# Patient Record
Sex: Female | Born: 1980 | Race: White | Hispanic: No | Marital: Married | Smoking: Never smoker
Health system: Southern US, Community
[De-identification: ages and names within clinical notes are randomized; demographics above are authoritative.]

## PROBLEM LIST (undated history)

## (undated) DIAGNOSIS — E119 Type 2 diabetes mellitus without complications: Secondary | ICD-10-CM

## (undated) HISTORY — PX: NECK SURGERY: SHX720

---

## 2010-12-04 ENCOUNTER — Emergency Department: Payer: Self-pay | Admitting: Emergency Medicine

## 2015-02-02 ENCOUNTER — Emergency Department: Payer: Self-pay | Admitting: Emergency Medicine

## 2018-03-19 ENCOUNTER — Emergency Department: Payer: BC Managed Care – PPO

## 2018-03-19 ENCOUNTER — Encounter: Payer: Self-pay | Admitting: Internal Medicine

## 2018-03-19 ENCOUNTER — Inpatient Hospital Stay
Admission: EM | Admit: 2018-03-19 | Discharge: 2018-03-22 | DRG: 638 | Disposition: A | Discharge status: Home / Self Care | Payer: BC Managed Care – PPO | Attending: Internal Medicine | Admitting: Internal Medicine

## 2018-03-19 DIAGNOSIS — K3184 Gastroparesis: Secondary | ICD-10-CM | POA: Diagnosis present

## 2018-03-19 DIAGNOSIS — E1010 Type 1 diabetes mellitus with ketoacidosis without coma: Secondary | ICD-10-CM

## 2018-03-19 DIAGNOSIS — E876 Hypokalemia: Secondary | ICD-10-CM | POA: Diagnosis not present

## 2018-03-19 DIAGNOSIS — Z9641 Presence of insulin pump (external) (internal): Secondary | ICD-10-CM | POA: Diagnosis present

## 2018-03-19 DIAGNOSIS — E1043 Type 1 diabetes mellitus with diabetic autonomic (poly)neuropathy: Secondary | ICD-10-CM | POA: Diagnosis present

## 2018-03-19 DIAGNOSIS — D6959 Other secondary thrombocytopenia: Secondary | ICD-10-CM | POA: Diagnosis present

## 2018-03-19 DIAGNOSIS — R1011 Right upper quadrant pain: Secondary | ICD-10-CM | POA: Diagnosis not present

## 2018-03-19 DIAGNOSIS — A084 Viral intestinal infection, unspecified: Secondary | ICD-10-CM | POA: Diagnosis present

## 2018-03-19 DIAGNOSIS — E1110 Type 2 diabetes mellitus with ketoacidosis without coma: Secondary | ICD-10-CM | POA: Diagnosis present

## 2018-03-19 DIAGNOSIS — E871 Hypo-osmolality and hyponatremia: Secondary | ICD-10-CM | POA: Diagnosis present

## 2018-03-19 DIAGNOSIS — E875 Hyperkalemia: Secondary | ICD-10-CM | POA: Diagnosis present

## 2018-03-19 DIAGNOSIS — J189 Pneumonia, unspecified organism: Secondary | ICD-10-CM

## 2018-03-19 HISTORY — DX: Type 2 diabetes mellitus without complications: E11.9

## 2018-03-19 LAB — URINALYSIS, COMPLETE (UACMP) WITH MICROSCOPIC
BILIRUBIN URINE: NEGATIVE
Glucose, UA: >=500 mg/dL — AB
KETONES UR: 80 mg/dL — AB
LEUKOCYTES UA: NEGATIVE
Nitrite: NEGATIVE
PROTEIN: 100 mg/dL — AB
SPECIFIC GRAVITY, URINE: 1.022 (ref 1.005–1.030)
pH: 5 (ref 5.0–8.0)

## 2018-03-19 LAB — CBC WITH DIFFERENTIAL/PLATELET
BASOS ABS: 0 10*3/uL (ref 0–0.1)
BASOS PCT: 0 %
EOS ABS: 0 10*3/uL (ref 0–0.7)
Eosinophils Relative: 0 %
HCT: 55.6 % — ABNORMAL HIGH (ref 35.0–47.0)
HEMOGLOBIN: 18 g/dL — AB (ref 12.0–16.0)
Lymphocytes Relative: 6 %
Lymphs Abs: 1.4 10*3/uL (ref 1.0–3.6)
MCH: 28.8 pg (ref 26.0–34.0)
MCHC: 32.3 g/dL (ref 32.0–36.0)
MCV: 89 fL (ref 80.0–100.0)
MONOS PCT: 2 %
Monocytes Absolute: 0.5 10*3/uL (ref 0.2–0.9)
NEUTROS PCT: 92 %
Neutro Abs: 22.7 10*3/uL — ABNORMAL HIGH (ref 1.4–6.5)
Platelets: 546 10*3/uL — ABNORMAL HIGH (ref 150–440)
RBC: 6.25 MIL/uL — ABNORMAL HIGH (ref 3.80–5.20)
RDW: 14.8 % — ABNORMAL HIGH (ref 11.5–14.5)
WBC: 24.7 10*3/uL — ABNORMAL HIGH (ref 3.6–11.0)

## 2018-03-19 LAB — COMPREHENSIVE METABOLIC PANEL
ALBUMIN: 5.7 g/dL — AB (ref 3.5–5.0)
ALK PHOS: 138 U/L — AB (ref 38–126)
ALT: 68 U/L — ABNORMAL HIGH (ref 14–54)
AST: 109 U/L — ABNORMAL HIGH (ref 15–41)
BUN: 17 mg/dL (ref 6–20)
CALCIUM: 9.5 mg/dL (ref 8.9–10.3)
Chloride: 104 mmol/L (ref 101–111)
Creatinine, Ser: 0.91 mg/dL (ref 0.44–1.00)
GFR calc Af Amer: >60 mL/min (ref >60)
GFR calc non Af Amer: >60 mL/min (ref >60)
GLUCOSE: 440 mg/dL — AB (ref 65–99)
Potassium: 5.6 mmol/L — ABNORMAL HIGH (ref 3.5–5.1)
SODIUM: 135 mmol/L (ref 135–145)
Total Bilirubin: 2 mg/dL — ABNORMAL HIGH (ref 0.3–1.2)
Total Protein: 10.5 g/dL — ABNORMAL HIGH (ref 6.5–8.1)

## 2018-03-19 LAB — GLUCOSE, CAPILLARY
GLUCOSE-CAPILLARY: 203 mg/dL — AB (ref 65–99)
GLUCOSE-CAPILLARY: 210 mg/dL — AB (ref 65–99)
GLUCOSE-CAPILLARY: 218 mg/dL — AB (ref 65–99)
Glucose-Capillary: 304 mg/dL — ABNORMAL HIGH (ref 65–99)
Glucose-Capillary: 247 mg/dL — ABNORMAL HIGH (ref 65–99)
Glucose-Capillary: 221 mg/dL — ABNORMAL HIGH (ref 65–99)

## 2018-03-19 LAB — MRSA PCR SCREENING: MRSA by PCR: NEGATIVE

## 2018-03-19 MED ORDER — LIDOCAINE HCL (PF) 1 % IJ SOLN
INTRAMUSCULAR | Status: AC
  Filled 2018-03-19: qty 5

## 2018-03-19 MED ORDER — DEXTROSE-NACL 5-0.45 % IV SOLN
INTRAVENOUS | Status: DC
  Administered 2018-03-19 – 2018-03-20 (×3): via INTRAVENOUS

## 2018-03-19 MED ORDER — SODIUM CHLORIDE 0.9 % IV SOLN: Freq: Once | INTRAVENOUS | Status: DC

## 2018-03-19 MED ORDER — ONDANSETRON HCL 4 MG/2ML IJ SOLN
INTRAMUSCULAR | Status: AC
  Administered 2018-03-19: 15:00:00
  Filled 2018-03-19: qty 2

## 2018-03-19 MED ORDER — ENOXAPARIN SODIUM 40 MG/0.4ML ~~LOC~~ SOLN
40.0000 mg | SUBCUTANEOUS | Status: DC
  Administered 2018-03-20: 40 mg via SUBCUTANEOUS
  Filled 2018-03-19: qty 0.4

## 2018-03-19 MED ORDER — MORPHINE SULFATE (PF) 2 MG/ML IV SOLN
2.0000 mg | INTRAVENOUS | Status: DC | PRN
  Administered 2018-03-19 (×2): 2 mg via INTRAVENOUS
  Filled 2018-03-19: qty 1

## 2018-03-19 MED ORDER — SODIUM CHLORIDE 0.9 % IV SOLN
1.0000 g | Freq: Every day | INTRAVENOUS | Status: DC
  Administered 2018-03-19 – 2018-03-21 (×3): 1 g via INTRAVENOUS
  Filled 2018-03-19 (×4): qty 10

## 2018-03-19 MED ORDER — SODIUM CHLORIDE 0.9 % IV SOLN
INTRAVENOUS | Status: DC
  Administered 2018-03-19: 19:00:00 via INTRAVENOUS

## 2018-03-19 MED ORDER — INSULIN ASPART 100 UNIT/ML ~~LOC~~ SOLN
10.0000 [IU] | Freq: Once | SUBCUTANEOUS | Status: AC
  Administered 2018-03-19: 10 [IU] via INTRAVENOUS
  Filled 2018-03-19: qty 1

## 2018-03-19 MED ORDER — OXYCODONE-ACETAMINOPHEN 5-325 MG PO TABS: 1.0000 | ORAL_TABLET | Freq: Four times a day (QID) | ORAL | Status: DC | PRN

## 2018-03-19 MED ORDER — FENTANYL CITRATE (PF) 100 MCG/2ML IJ SOLN
50.0000 ug | Freq: Once | INTRAMUSCULAR | Status: AC
  Administered 2018-03-19: 50 ug via INTRAVENOUS

## 2018-03-19 MED ORDER — SODIUM CHLORIDE 0.9 % IV SOLN
INTRAVENOUS | Status: DC
  Administered 2018-03-19: 3.8 [IU]/h via INTRAVENOUS
  Filled 2018-03-19: qty 1

## 2018-03-19 MED ORDER — LIDOCAINE HCL (PF) 1 % IJ SOLN: 5.0000 mL | Freq: Once | INTRAMUSCULAR | Status: DC

## 2018-03-19 MED ORDER — ACETAMINOPHEN 325 MG PO TABS: 650.0000 mg | ORAL_TABLET | Freq: Four times a day (QID) | ORAL | Status: DC | PRN

## 2018-03-19 MED ORDER — BISACODYL 5 MG PO TBEC: 5.0000 mg | DELAYED_RELEASE_TABLET | Freq: Every day | ORAL | Status: DC | PRN

## 2018-03-19 MED ORDER — MORPHINE SULFATE (PF) 2 MG/ML IV SOLN
INTRAVENOUS | Status: AC
  Filled 2018-03-19: qty 1

## 2018-03-19 MED ORDER — SODIUM CHLORIDE 0.9 % IV BOLUS
1000.0000 mL | Freq: Once | INTRAVENOUS | Status: AC
  Administered 2018-03-19: 1000 mL via INTRAVENOUS

## 2018-03-19 MED ORDER — SODIUM CHLORIDE 0.9 % IV SOLN: INTRAVENOUS | Status: AC

## 2018-03-19 MED ORDER — SODIUM CHLORIDE 0.9 % IV SOLN
INTRAVENOUS | Status: DC
  Administered 2018-03-20: 7.2 [IU]/h via INTRAVENOUS
  Filled 2018-03-19 (×8): qty 1

## 2018-03-19 MED ORDER — ONDANSETRON HCL 4 MG/2ML IJ SOLN
4.0000 mg | Freq: Four times a day (QID) | INTRAMUSCULAR | Status: DC | PRN
  Administered 2018-03-19 – 2018-03-21 (×3): 4 mg via INTRAVENOUS
  Filled 2018-03-19 (×2): qty 2

## 2018-03-19 MED ORDER — ONDANSETRON HCL 4 MG/2ML IJ SOLN
INTRAMUSCULAR | Status: AC
  Filled 2018-03-19: qty 2

## 2018-03-19 MED ORDER — SENNOSIDES-DOCUSATE SODIUM 8.6-50 MG PO TABS: 1.0000 | ORAL_TABLET | Freq: Every evening | ORAL | Status: DC | PRN

## 2018-03-19 NOTE — Progress Notes (Signed)
Pharmacy Antibiotic Note  Dyke Maesshley Cope is a 37 y.o. female admitted on 03/19/2018 with UTI.  Pharmacy has been consulted for CTX dosing.  Plan: CTX 1 g iv daily.   Height: 5\' 2"  (157.5 cm) Weight: 160 lb (72.6 kg) IBW/kg (Calculated) : 50.1  Temp (24hrs), Avg:98.4 F (36.9 C), Min:97.5 F (36.4 C), Max:99.6 F (37.6 C)  Recent Labs  Lab 03/19/18 1451  WBC 24.7*  CREATININE 0.91    Estimated Creatinine Clearance: 79.7 mL/min (by C-G formula based on SCr of 0.91 mg/dL).    Allergies  Allergen Reactions  . Sulfa Antibiotics Swelling    Antimicrobials this admission: CTX 3/28  >>   Dose adjustments this admission:   Microbiology results: 3/28 UCx: sent  3/28 MRSA PCR: sent  Thank you for allowing pharmacy to be a part of this patient's care.  Valentina GuChristy, Brenn Deziel D 03/19/2018 7:34 PM

## 2018-03-19 NOTE — H&P (Addendum)
[facility] - [place] [facility]   PATIENT NAME: Lindsey Gardner    MR#:  [id number]  DATE OF BIRTH:  1981-03-22  DATE OF ADMISSION:  03/19/2018  PRIMARY CARE PHYSICIAN: No primary care provider on file.   REQUESTING/REFERRING PHYSICIAN: Dr. Roxan Hockeyobinson.  CHIEF COMPLAINT:   Chief Complaint  Patient presents with  . Abdominal Pain   Nausea vomiting and abdominal pain since yesterday. HISTORY OF PRESENT ILLNESS:  Lindsey Gardner  is a 37 y.o. female with a known history of type 1 diabetes and history of DKA twice.  The patient was sent to ED due to above chief complaints.  Her son has some abdominal pain, soft to be viral infection a few days ago.  She developed abdominal pain with nausea no vomiting since yesterday.  She also has fever and dysuria.  She is found DKA with not calculated anion gap and hyperglycemia.  Insulin drip is started.  PAST MEDICAL HISTORY:   Past Medical History:  Diagnosis Date  . DM (diabetes mellitus) (HCC)     PAST SURGICAL HISTORY:  No surgical history.  SOCIAL HISTORY:   Social History   Tobacco Use  . Smoking status: Never Smoker  Substance Use Topics  . Alcohol use: Not on file    FAMILY HISTORY:   Family History  Problem Relation Age of Onset  . Diabetes Mother   . Hypertension Mother     DRUG ALLERGIES:  Not on File  REVIEW OF SYSTEMS:   Review of Systems  Constitutional: Positive for chills, fever and malaise/fatigue.  HENT: Negative for sore throat.   Eyes: Negative for blurred vision and double vision.  Respiratory: Positive for shortness of breath. Negative for cough, hemoptysis, wheezing and stridor.   Cardiovascular: Negative for chest pain, palpitations, orthopnea and leg swelling.  Gastrointestinal: Positive for abdominal pain, nausea and vomiting. Negative for blood in stool, diarrhea and melena.  Genitourinary: Positive for dysuria and frequency. Negative for flank pain, hematuria and urgency.    Musculoskeletal: Negative for back pain and joint pain.  Neurological: Negative for dizziness, sensory change, focal weakness, seizures, loss of consciousness, weakness and headaches.  Endo/Heme/Allergies: Negative for polydipsia.  Psychiatric/Behavioral: Negative for depression. The patient is not nervous/anxious.     MEDICATIONS AT HOME:   Prior to Admission medications   Not on File      VITAL SIGNS:  Blood pressure (!) 144/98, temperature (!) 97.5 F (36.4 C), temperature source Oral, resp. rate 19, height 5\' 2"  (1.575 m), weight 160 lb (72.6 kg), SpO2 100 %.  PHYSICAL EXAMINATION:  Physical Exam  GENERAL:  37 y.o.-year-old patient lying in the bed with no acute distress.  EYES: Pupils equal, round, reactive to light and accommodation. No scleral icterus. Extraocular muscles intact.  HEENT: Head atraumatic, normocephalic. Oropharynx and nasopharynx clear.  NECK:  Supple, no jugular venous distention. No thyroid enlargement, no tenderness.  LUNGS: Normal breath sounds bilaterally, no wheezing, rales,rhonchi or crepitation. No use of accessory muscles of respiration.  CARDIOVASCULAR: S1, S2 normal. No murmurs, rubs, or gallops.  ABDOMEN: Soft, nontender, nondistended. Bowel sounds present. No organomegaly or mass.  EXTREMITIES: No pedal edema, cyanosis, or clubbing.  NEUROLOGIC: Cranial nerves II through XII are intact. Muscle strength 5/5 in all extremities. Sensation intact. Gait not checked.  PSYCHIATRIC: The patient is alert and oriented x 3.  SKIN: No obvious rash, lesion, or ulcer.   LABORATORY PANEL:   CBC Recent Labs  Lab 03/19/18 1451  WBC 24.7*  HGB 18.0*  HCT 55.6*  PLT 546*   ------------------------------------------------------------------------------------------------------------------  Chemistries  Recent Labs  Lab 03/19/18 1451  NA 135  K 5.6*  CL 104  CO2 <7*  GLUCOSE 440*  BUN 17  CREATININE 0.91  CALCIUM 9.5  AST 109*  ALT 68*   ALKPHOS 138*  BILITOT 2.0*   ------------------------------------------------------------------------------------------------------------------  Cardiac Enzymes No results for input(s): TROPONINI in the last 168 hours. ------------------------------------------------------------------------------------------------------------------  RADIOLOGY:  Dg Chest Portable 1 View  Result Date: 03/19/2018 CLINICAL DATA:  Nausea and vomiting EXAM: PORTABLE CHEST 1 VIEW COMPARISON:  None. FINDINGS: The heart size and mediastinal contours are within normal limits. Both lungs are clear. The visualized skeletal structures are unremarkable. IMPRESSION: No active disease. Electronically Signed   By: Jasmine Pang M.D.   On: 03/19/2018 16:07      IMPRESSION AND PLAN:   DKA with type 1 diabetes. The patient will be admitted to stepdown unit.  BMP every 4 hours. Start insulin drip, normal saline IV and DKA protocol. Zofran as needed.  Hyperkalemia.  Continue insulin drip in the follow-up BMP.  UTI with leukocytosis. Start Rocephin and follow-up CBC and urine culture.  Abdominal pain with elevated liver function test.  Follow-up abdominal ultrasound.  Tachycardia.  Telemetry monitor.  Thrombocytopenia, due to reaction.  Follow-up CBC.  Discussed with Dr. Gerlene Burdock, intensivist. All the records are reviewed and case discussed with ED provider. Management plans discussed with the patient, family and they are in agreement.  CODE STATUS: Full code  TOTAL CRITICAL TIME TAKING CARE OF THIS PATIENT: 55 minutes.    Shaune Pollack M.D on 03/19/2018 at 5:56 PM  Between 7am to 6pm - Pager - [phone]  After 6pm go to [email or web address] - Scientist, research (life sciences) [facility]  Office  [phone]  CC: Primary care physician; No primary care provider on file.   Note: This dictation was prepared with Dragon dictation along with smaller phrase technology. Any transcriptional errors  that result from this process are unin

## 2018-03-19 NOTE — ED Triage Notes (Signed)
Pt presents today via [facility] for vomiting. Pt has nothing Po and [facility] reports 355 BS DKA type 1.

## 2018-03-19 NOTE — ED Provider Notes (Signed)
[facility] Emergency Department Provider Note    First MD Initiated Contact with Patient 03/19/18 1429     (approximate)  I have reviewed the triage vital signs and the nursing notes.   HISTORY  Chief Complaint Abdominal Pain    HPI Lindsey Gardner is a 37 y.o. female to the ER with intractable nausea vomiting some epigastric discomfort and elevated blood glucose at home.  Patient is a type I diabetic on insulin pump at home but states that due to nausea vomiting she has not been able to control her blood sugars.  Has had some dysuria.  No measured fevers.  Does feel very thirsty and she feels short of breath.  States that she feels similar to previous episodes of DKA.  No past medical history on file. No family history on file.  There are no active problems to display for this patient.     Prior to Admission medications   Not on File    Allergies Patient has no allergy information on record.    Social History Social History   Tobacco Use  . Smoking status: Not on file  Substance Use Topics  . Alcohol use: Not on file  . Drug use: Not on file    Review of Systems Patient denies headaches, rhinorrhea, blurry vision, numbness, shortness of breath, chest pain, edema, cough, abdominal pain, nausea, vomiting, diarrhea, dysuria, fevers, rashes or hallucinations unless otherwise stated above in HPI. ____________________________________________   PHYSICAL EXAM:  VITAL SIGNS: Vitals:   03/19/18 1452  BP: (!) 144/98  Resp: 19  Temp: (!) 97.5 F (36.4 C)  SpO2: 100%    Constitutional: Alert and oriented. ill appearing  Eyes: Conjunctivae are normal.  Head: Atraumatic. Nose: No congestion/rhinnorhea. Mouth/Throat: Mucous membranes are moist.   Neck: No stridor. Painless ROM.  Cardiovascular: Normal rate, regular rhythm. Grossly normal heart sounds.  Good peripheral circulation. Respiratory: kussmaul respirations, no crackles or  rhonchi Gastrointestinal: Soft with mild epigastric ttp. No distention. No abdominal bruits. No CVA tenderness. Genitourinary: deferred Musculoskeletal: No lower extremity tenderness nor edema.  No joint effusions. Neurologic:  Normal speech and language. No gross focal neurologic deficits are appreciated. No facial droop Skin:  Skin is warm, dry and intact. No rash noted. Psychiatric: Mood and affect are normal. Speech and behavior are normal.  ____________________________________________   LABS (all labs ordered are listed, but only abnormal results are displayed)  Results for orders placed or performed during the hospital encounter of 03/19/18 (from the past 24 hour(s))  CBC with Differential/Platelet     Status: Abnormal   Collection Time: 03/19/18  2:51 PM  Result Value Ref Range   WBC 24.7 (H) 3.6 - 11.0 K/uL   RBC 6.25 (H) 3.80 - 5.20 MIL/uL   Hemoglobin 18.0 (H) 12.0 - 16.0 g/dL   HCT 16.155.6 (H) 09.635.0 - 04.547.0 %   MCV 89.0 80.0 - 100.0 fL   MCH 28.8 26.0 - 34.0 pg   MCHC 32.3 32.0 - 36.0 g/dL   RDW 40.914.8 (H) 81.111.5 - 91.414.5 %   Platelets 546 (H) 150 - 440 K/uL   Neutrophils Relative % 92 %   Neutro Abs 22.7 (H) 1.4 - 6.5 K/uL   Lymphocytes Relative 6 %   Lymphs Abs 1.4 1.0 - 3.6 K/uL   Monocytes Relative 2 %   Monocytes Absolute 0.5 0.2 - 0.9 K/uL   Eosinophils Relative 0 %   Eosinophils Absolute 0.0 0 - 0.7 K/uL   Basophils  Relative 0 %   Basophils Absolute 0.0 0 - 0.1 K/uL  Comprehensive metabolic panel     Status: Abnormal   Collection Time: 03/19/18  2:51 PM  Result Value Ref Range   Sodium 135 135 - 145 mmol/L   Potassium 5.6 (H) 3.5 - 5.1 mmol/L   Chloride 104 101 - 111 mmol/L   CO2 <7 (L) 22 - 32 mmol/L   Glucose, Bld 440 (H) 65 - 99 mg/dL   BUN 17 6 - 20 mg/dL   Creatinine, Ser 1.61 0.44 - 1.00 mg/dL   Calcium 9.5 8.9 - 09.6 mg/dL   Total Protein 04.5 (H) 6.5 - 8.1 g/dL   Albumin 5.7 (H) 3.5 - 5.0 g/dL   AST 409 (H) 15 - 41 U/L   ALT 68 (H) 14 - 54 U/L    Alkaline Phosphatase 138 (H) 38 - 126 U/L   Total Bilirubin 2.0 (H) 0.3 - 1.2 mg/dL   GFR calc non Af Amer >60 >60 mL/min   GFR calc Af Amer >60 >60 mL/min   Anion gap NOT CALCULATED 5 - 15  Urinalysis, Complete w Microscopic     Status: Abnormal   Collection Time: 03/19/18  2:51 PM  Result Value Ref Range   Color, Urine YELLOW (A) YELLOW   APPearance CLEAR (A) CLEAR   Specific Gravity, Urine 1.022 1.005 - 1.030   pH 5.0 5.0 - 8.0   Glucose, UA >=500 (A) NEGATIVE mg/dL   Hgb urine dipstick MODERATE (A) NEGATIVE   Bilirubin Urine NEGATIVE NEGATIVE   Ketones, ur 80 (A) NEGATIVE mg/dL   Protein, ur 811 (A) NEGATIVE mg/dL   Nitrite NEGATIVE NEGATIVE   Leukocytes, UA NEGATIVE NEGATIVE   RBC / HPF 0-5 0 - 5 RBC/hpf   WBC, UA 6-30 0 - 5 WBC/hpf   Bacteria, UA RARE (A) NONE SEEN   Squamous Epithelial / LPF 0-5 (A) NONE SEEN   Mucus PRESENT    ____________________________________________  EKG My review and personal interpretation at Time:   17:35 Indication: dka  Rate: 130  Rhythm: sinus Axis: normal Other: normal intervals, no stemi,  ____________________________________________  RADIOLOGY  I personally reviewed all radiographic images ordered to evaluate for the above acute complaints and reviewed radiology reports and findings.  These findings were personally discussed with the patient.  Please see medical record for radiology report.  ____________________________________________   PROCEDURES  Procedure(s) performed:  .Critical Care Performed by: Willy Eddy, MD Authorized by: Willy Eddy, MD   Critical care provider statement:    Critical care time (minutes):  40   Critical care time was exclusive of:  Separately billable procedures and treating other patients   Critical care was necessary to treat or prevent imminent or life-threatening deterioration of the following conditions:  Endocrine crisis   Critical care was time spent personally by me on the  following activities:  Development of treatment plan with patient or surrogate, discussions with consultants, evaluation of patient's response to treatment, examination of patient, obtaining history from patient or surrogate, ordering and performing treatments and interventions, ordering and review of laboratory studies, ordering and review of radiographic studies, pulse oximetry, re-evaluation of patient's condition and review of old charts      Critical Care performed: yes ____________________________________________   INITIAL IMPRESSION / ASSESSMENT AND PLAN / ED COURSE  Pertinent labs & imaging results that were available during my care of the patient were reviewed by me and considered in my medical decision making (see chart for  details).  DDX: dka, hhs, uti, enteritis, gastritis, pna  Kendle Turbin is a 37 y.o. who presents to the ED with symptoms as described above.  Patient with evidence of DKA.  Noted to be hyperkalemic with profound high anion gap metabolic acidosis.  Patient given large volume IV resuscitation and started on insulin drip.  Ultrasound will be ordered to further characterize any biliary pathology that could have set up her symptoms but clinically it seems more consistent with a gastroenteritis.  Does have mild elevated LFTs which could be secondary to profound dehydration.  Patient will require hospitalization for additional IV fluids and resuscitation.      As part of my medical decision making, I reviewed the following data within the electronic MEDICAL RECORD NUMBER Nursing notes reviewed and incorporated, Labs reviewed, notes from prior ED visits.   ____________________________________________   FINAL CLINICAL IMPRESSION(S) / ED DIAGNOSES  Final diagnoses:  RUQ abdominal pain  Diabetic ketoacidosis without coma associated with type 1 diabetes mellitus (HCC)      NEW MEDICATIONS STARTED DURING THIS VISIT:  New Prescriptions   No medications on file      Note:  This document was prepared using Dragon voice recognition software and may include unintentional dictation errors.    Willy Eddy, MD 03/19/18 1740

## 2018-03-20 DIAGNOSIS — E1010 Type 1 diabetes mellitus with ketoacidosis without coma: Principal | ICD-10-CM

## 2018-03-20 LAB — GLUCOSE, CAPILLARY
GLUCOSE-CAPILLARY: 171 mg/dL — AB (ref 65–99)
GLUCOSE-CAPILLARY: 147 mg/dL — AB (ref 65–99)
GLUCOSE-CAPILLARY: 211 mg/dL — AB (ref 65–99)
GLUCOSE-CAPILLARY: 156 mg/dL — AB (ref 65–99)
GLUCOSE-CAPILLARY: 158 mg/dL — AB (ref 65–99)
GLUCOSE-CAPILLARY: 190 mg/dL — AB (ref 65–99)
GLUCOSE-CAPILLARY: 212 mg/dL — AB (ref 65–99)
Glucose-Capillary: 115 mg/dL — ABNORMAL HIGH (ref 65–99)
Glucose-Capillary: 122 mg/dL — ABNORMAL HIGH (ref 65–99)
Glucose-Capillary: 124 mg/dL — ABNORMAL HIGH (ref 65–99)
Glucose-Capillary: 125 mg/dL — ABNORMAL HIGH (ref 65–99)
Glucose-Capillary: 125 mg/dL — ABNORMAL HIGH (ref 65–99)
Glucose-Capillary: 128 mg/dL — ABNORMAL HIGH (ref 65–99)
Glucose-Capillary: 131 mg/dL — ABNORMAL HIGH (ref 65–99)
Glucose-Capillary: 141 mg/dL — ABNORMAL HIGH (ref 65–99)
Glucose-Capillary: 151 mg/dL — ABNORMAL HIGH (ref 65–99)
Glucose-Capillary: 172 mg/dL — ABNORMAL HIGH (ref 65–99)
Glucose-Capillary: 180 mg/dL — ABNORMAL HIGH (ref 65–99)
Glucose-Capillary: 181 mg/dL — ABNORMAL HIGH (ref 65–99)
Glucose-Capillary: 205 mg/dL — ABNORMAL HIGH (ref 65–99)
Glucose-Capillary: 208 mg/dL — ABNORMAL HIGH (ref 65–99)

## 2018-03-20 LAB — BASIC METABOLIC PANEL
ANION GAP: 8 (ref 5–15)
ANION GAP: 11 (ref 5–15)
Anion gap: 11 (ref 5–15)
Anion gap: 11 (ref 5–15)
Anion gap: 10 (ref 5–15)
BUN: 11 mg/dL (ref 6–20)
BUN: 10 mg/dL (ref 6–20)
BUN: 10 mg/dL (ref 6–20)
BUN: 9 mg/dL (ref 6–20)
BUN: 7 mg/dL (ref 6–20)
CALCIUM: 8.1 mg/dL — AB (ref 8.9–10.3)
CALCIUM: 8.3 mg/dL — AB (ref 8.9–10.3)
CALCIUM: 8.4 mg/dL — AB (ref 8.9–10.3)
CHLORIDE: 116 mmol/L — AB (ref 101–111)
CO2: 8 mmol/L — ABNORMAL LOW (ref 22–32)
CO2: 11 mmol/L — ABNORMAL LOW (ref 22–32)
CO2: 12 mmol/L — ABNORMAL LOW (ref 22–32)
CO2: 9 mmol/L — ABNORMAL LOW (ref 22–32)
CO2: 13 mmol/L — AB (ref 22–32)
CREATININE: 0.42 mg/dL — AB (ref 0.44–1.00)
Calcium: 7.9 mg/dL — ABNORMAL LOW (ref 8.9–10.3)
Calcium: 8 mg/dL — ABNORMAL LOW (ref 8.9–10.3)
Chloride: 115 mmol/L — ABNORMAL HIGH (ref 101–111)
Chloride: 113 mmol/L — ABNORMAL HIGH (ref 101–111)
Chloride: 114 mmol/L — ABNORMAL HIGH (ref 101–111)
Chloride: 113 mmol/L — ABNORMAL HIGH (ref 101–111)
Creatinine, Ser: 0.59 mg/dL (ref 0.44–1.00)
Creatinine, Ser: 0.55 mg/dL (ref 0.44–1.00)
Creatinine, Ser: 0.6 mg/dL (ref 0.44–1.00)
Creatinine, Ser: 0.6 mg/dL (ref 0.44–1.00)
GFR calc Af Amer: >60 mL/min (ref >60)
GFR calc Af Amer: >60 mL/min (ref >60)
GLUCOSE: 189 mg/dL — AB (ref 65–99)
Glucose, Bld: 144 mg/dL — ABNORMAL HIGH (ref 65–99)
Glucose, Bld: 129 mg/dL — ABNORMAL HIGH (ref 65–99)
Glucose, Bld: 220 mg/dL — ABNORMAL HIGH (ref 65–99)
Glucose, Bld: 112 mg/dL — ABNORMAL HIGH (ref 65–99)
POTASSIUM: 4.4 mmol/L (ref 3.5–5.1)
POTASSIUM: 3.4 mmol/L — AB (ref 3.5–5.1)
POTASSIUM: 3.5 mmol/L (ref 3.5–5.1)
POTASSIUM: 3.9 mmol/L (ref 3.5–5.1)
POTASSIUM: 3.8 mmol/L (ref 3.5–5.1)
SODIUM: 134 mmol/L — AB (ref 135–145)
SODIUM: 135 mmol/L (ref 135–145)
SODIUM: 134 mmol/L — AB (ref 135–145)
SODIUM: 133 mmol/L — AB (ref 135–145)
SODIUM: 139 mmol/L (ref 135–145)

## 2018-03-20 LAB — BLOOD GAS, VENOUS
ACID-BASE DEFICIT: 10.9 mmol/L — AB (ref 0.0–2.0)
Acid-base deficit: 7.5 mmol/L — ABNORMAL HIGH (ref 0.0–2.0)
BICARBONATE: 14.6 mmol/L — AB (ref 20.0–28.0)
BICARBONATE: 17.4 mmol/L — AB (ref 20.0–28.0)
FIO2: 21
O2 SAT: 58.2 %
O2 SAT: 70.5 %
PCO2 VEN: 31 mmHg — AB (ref 44.0–60.0)
PCO2 VEN: 33 mmHg — AB (ref 44.0–60.0)
PO2 VEN: 35 mmHg (ref 32.0–45.0)
PO2 VEN: 40 mmHg (ref 32.0–45.0)
Patient temperature: 37
Patient temperature: 37
pH, Ven: 7.28 (ref 7.250–7.430)
pH, Ven: 7.33 (ref 7.250–7.430)

## 2018-03-20 MED ORDER — PROMETHAZINE HCL 25 MG/ML IJ SOLN
10.0000 mg | Freq: Once | INTRAMUSCULAR | Status: AC
  Administered 2018-03-20: 10 mg via INTRAVENOUS
  Filled 2018-03-20: qty 1

## 2018-03-20 MED ORDER — PROMETHAZINE HCL 25 MG/ML IJ SOLN: 10.0000 mg | Freq: Four times a day (QID) | INTRAMUSCULAR | Status: DC | PRN

## 2018-03-20 NOTE — Progress Notes (Signed)
MD- Once CO2 is closer to 20 and you deem patient safe to transition to SQ insulin, please consider either of the following:  1. Have patient resume her insulin pump with all new supplies (husband to bring supplies).   2. Once pump resumed, discontinue IV insulin drip 1 hour after pump resumed  3. Place Insulin Pump order set into [facility] if pt's husband cannot get fresh insulin pump supplies to hospital by early this afternoon and pt ready to transition to SQ insulin, recommend the following:   1. Start Lantus 45 units daily (80% total basal insulin pt gets on pump).  Make sure pt gets Lantus 1 hour prior to d/c of IV Insulin drip  2. Start Novolog Moderate Correction Scale/ SSI (0-20 units) TID AC + HS  3. Start Novolog Meal Coverage: Novolog 3 units TID with meals (hold if pt eats <50% of meal)  4. Would have pt resume insulin pump tomorrow (03/30) and then can d/c SQ insulin orders tomorrow as well.  Patient will need to suspend her basal rates until 24 hours after last dose Lantus given.   Met with patient around 12:30pm.  Patient A&O but still having nausea.  Insulin Pump off but at beside.  Explained to pt that if the MD allows pt to resume her insulin pump that she will need to use all new supplies (insertion site, tubing, reservoir, insulin, etc) per hospital policy regarding insulin pumps and DKA.  Pt stated she would ask her husband to bring the supplies to the hospital. Not sure what time husband can get here.  Explained to patient (also relayed the following info to Dr. Verdell Carmine and pt's RN Romie Minus) that once pt's CO2 is closer to 20 that we can have pt resume her insulin pump with all new supplies (with the MD's permission) and then d/c the IV insulin drip 1 hour after the pump has been restarted.  If MD wishes for pt to transition to SQ insulin before the pump supplies arrive, we could transition to Lantus and Novolog regimen and have pt resume her insulin pump  tomorrow.  Pt would need to suspend her basal rates on her pump until 24 hours after Lantus given if she resumes her pump tomorrow AM and Lantus is given this afternoon to make sure the basal rates on the pump and the Lantus don't overlap.  Pt understood and said she would be agreeable to either situation.  Currently trying to contact husband to get fresh pump supplies.  Reviewed Insulin pump settings with patient.  They are as follows:  --Insulin Pump Settings--  Basal Rates: 12am- 1.9 units/hr 6am- 2.5 units/hr 11am- 2.4 units/hr 5pm- 2.9 units/hr  Total Basal Insulin per 24 hours period= 58.6 units  Carbohydrate Ratio: 1 unit for every 15 Grams of Carbohydrates  Correction/Sensitivity Factor: 1 unit for every 20 mg/dl above Target CBG  Target CBG: 100-130 mg/dl     --Will follow patient during hospitalization--  Wyn Quaker RN, MSN, CDE Diabetes Coordinator Inpatient Glycemic Control Team Team Pager: [phone] (8a-5p)

## 2018-03-20 NOTE — Progress Notes (Signed)
Inpatient Diabetes Program Recommendations  AACE/ADA: New Consensus Statement on Inpatient Glycemic Control (2015)  Target Ranges:  Prepandial:   less than 140 mg/dL      Peak postprandial:   less than 180 mg/dL (1-2 hours)      Critically ill patients:  140 - 180 mg/dL   Results for Lindsey Gardner, Lindsey Gardner (MRN [id number]) as of 03/20/2018 07:32  Ref. Range 03/19/2018 14:51  Sodium Latest Ref Range: 135 - 145 mmol/L 135  Potassium Latest Ref Range: 3.5 - 5.1 mmol/L 5.6 (H)  Chloride Latest Ref Range: 101 - 111 mmol/L 104  CO2 Latest Ref Range: 22 - 32 mmol/L <7 (L)  Glucose Latest Ref Range: 65 - 99 mg/dL 782440 (H)  BUN Latest Ref Range: 6 - 20 mg/dL 17  Creatinine Latest Ref Range: 0.44 - 1.00 mg/dL 9.560.91  Calcium Latest Ref Range: 8.9 - 10.3 mg/dL 9.5  Anion gap Latest Ref Range: 5 - 15  NOT CALCULATED    Admit with: DKA  History: Type 1 DM  Home DM Meds: Insulin Pump with Humalog  Current Insulin Orders: IV Insulin Drip       MD- Note patient uses Insulin Pump at home.  If she has access to fresh pump supplies, we can transition her back to her insulin pump once her acidosis has cleared.  Note 5am BMET today shows Anion Gap improved to 11, however, CO2 still Low this AM at 11.  Do Not recommend transitioning off the IV Insulin drip until CO2 is closer to 20.  When patient is ready to transition off drip (AND if she has extra pump supplies), can transition pt back to her insulin pump.  Usual protocol has pt resume pump and then d/c IV insulin drip 1 hour after pump resumed.    --Will follow patient during hospitalization--  Ambrose FinlandJeannine Johnston Batu Cassin RN, MSN, CDE Diabetes Coordinator Inpatient Glycemic Control Team Team Pager: [phone] (8a-5p)

## 2018-03-20 NOTE — Progress Notes (Signed)
[facility] - [place] [facility]   PATIENT NAME: Lindsey Gardner    MR#:  [id number]  DATE OF BIRTH:  [birth year]  SUBJECTIVE:   Patient presented to the hospital due to abdominal pain/nausea vomiting and noted to be in acute diabetic ketoacidosis.  This is likely secondary to patient developing a viral gastroenteritis which she got from her child.  Patient remains on insulin drip bicarb is still low.  Patient says nausea and vomiting is better controlled with some Zofran.  REVIEW OF SYSTEMS:    Review of Systems  Constitutional: Negative for chills and fever.  HENT: Negative for congestion and tinnitus.   Eyes: Negative for blurred vision and double vision.  Respiratory: Negative for cough, shortness of breath and wheezing.   Cardiovascular: Negative for chest pain, orthopnea and PND.  Gastrointestinal: Positive for nausea. Negative for abdominal pain, diarrhea and vomiting.  Genitourinary: Negative for dysuria and hematuria.  Neurological: Negative for dizziness, sensory change and focal weakness.  All other systems reviewed and are negative.   Nutrition: NPO Tolerating Diet: No  Tolerating PT: Ambulatory   DRUG ALLERGIES:   Allergies  Allergen Reactions  . Sulfa Antibiotics Swelling    VITALS:  Blood pressure 109/68, pulse 80, temperature 98.5 F (36.9 C), temperature source Oral, resp. rate 17, height 5\' 2"  (1.575 m), weight 72.6 kg (160 lb), SpO2 100 %.  PHYSICAL EXAMINATION:   Physical Exam  GENERAL:  37 y.o.-year-old patient lying in bed in no acute distress.  EYES: Pupils equal, round, reactive to light and accommodation. No scleral icterus. Extraocular muscles intact.  HEENT: Head atraumatic, normocephalic. Oropharynx and nasopharynx clear. Dry Oral Mucosa.  NECK:  Supple, no jugular venous distention. No thyroid enlargement, no tenderness.  LUNGS: Normal breath sounds bilaterally, no wheezing, rales, rhonchi. No use of accessory muscles of  respiration.  CARDIOVASCULAR: S1, S2 normal. No murmurs, rubs, or gallops.  ABDOMEN: Soft, nontender, nondistended. Bowel sounds present. No organomegaly or mass.  EXTREMITIES: No cyanosis, clubbing or edema b/l.    NEUROLOGIC: Cranial nerves II through XII are intact. No focal Motor or sensory deficits b/l.   PSYCHIATRIC: The patient is alert and oriented x 3.  SKIN: No obvious rash, lesion, or ulcer.    LABORATORY PANEL:   CBC Recent Labs  Lab 03/19/18 1451  WBC 24.7*  HGB 18.0*  HCT 55.6*  PLT 546*   ------------------------------------------------------------------------------------------------------------------  Chemistries  Recent Labs  Lab 03/19/18 1451  03/20/18 1250  NA 135   < > 133*  K 5.6*   < > 3.9  CL 104   < > 113*  CO2 <7*   < > 9*  GLUCOSE 440*   < > 220*  BUN 17   < > 9  CREATININE 0.91   < > 0.60  CALCIUM 9.5   < > 8.3*  AST 109*  --   --   ALT 68*  --   --   ALKPHOS 138*  --   --   BILITOT 2.0*  --   --    < > = values in this interval not displayed.   ------------------------------------------------------------------------------------------------------------------  Cardiac Enzymes No results for input(s): TROPONINI in the last 168 hours. ------------------------------------------------------------------------------------------------------------------  RADIOLOGY:  Dg Chest Portable 1 View  Result Date: 03/19/2018 CLINICAL DATA:  Nausea and vomiting EXAM: PORTABLE CHEST 1 VIEW COMPARISON:  None. FINDINGS: The heart size and mediastinal contours are within normal limits. Both lungs are clear. The visualized skeletal structures are unremarkable.  IMPRESSION: No active disease. Electronically Signed   By: Jasmine Pang M.D.   On: 03/19/2018 16:07   US Abdomen Limited Ruq  Result Date: 03/19/2018 CLINICAL DATA:  Right upper quadrant pain EXAM: ULTRASOUND ABDOMEN LIMITED RIGHT UPPER QUADRANT COMPARISON:  None. FINDINGS: Gallbladder: No gallstones or  wall thickening visualized. No sonographic Murphy sign noted by sonographer. Common bile duct: Diameter: 3 mm Liver: Increased echogenicity. No focal hepatic abnormality. Portal vein is patent on color Doppler imaging with normal direction of blood flow towards the liver. IMPRESSION: 1. Negative for gallstones or biliary dilatation 2. Increased hepatic echogenicity suggesting fatty infiltration Electronically Signed   By: Jasmine Pang M.D.   On: 03/19/2018 18:23     ASSESSMENT AND PLAN:   37 year old female with past medical history of type 1 diabetes with history of DKA who presents to the hospital due to abdominal pain nausea vomiting and noted to be in acute diabetic ketoacidosis.  1.  Acute diabetic ketoacidosis- secondary to patient developing a viral gastroenteritis from her child. -Patient is normally on an insulin pump and was using it and her blood sugars have been stable until she developed the viral illness. - Continue IV fluids, insulin drip.  Patient's bicarb is still less than 10.  We will transition her off the insulin drip once her bicarbonate and acidosis has improved. -Follow serial electrolytes and replace accordingly.  Patient can be transitioned back to the insulin pump as per diabetes coordinator once weaned off the insulin drip.  2. Suspected UTI - will cont. Ceftriaxone empirically.  - follow cultures.   3.  Leukocytosis-secondary to DKA/suspected UTI.  Continue IV antibiotics treat the underlying DKA, follow white cell count.  4.  Hyponatremia-mild secondary to severe hyperglycemia, should correct as her sugars and DKA improves.   All the records are reviewed and case discussed with Care Management/Social Worker. Management plans discussed with the patient, family and they are in agreement.  CODE STATUS: Full code  DVT Prophylaxis: Lovenox  TOTAL TIME TAKING CARE OF THIS PATIENT: 30 minutes.   POSSIBLE D/C IN 1-2 DAYS, DEPENDING ON CLINICAL  CONDITION.   Houston Siren M.D on 03/20/2018 at 4:24 PM  Between 7am to 6pm - Pager - [phone]  After 6pm go to [email or web address] - Social research officer, government  [facility] [place] [facility]  Office  [phone]  CC: Primary care physician; System, Pcp Not In

## 2018-03-20 NOTE — Care Management (Signed)
RNCM spoke with patient and her husband regarding PCP establishment. Patient states that [facility] [phone] PCP is Dr. Rocky LinkSzabo. Follow up appointment made for 03/31/18 at [facility].

## 2018-03-20 NOTE — Progress Notes (Signed)
Pharmacy Electrolyte Monitoring Consult:  Pharmacy consulted to assist in monitoring and replacing electrolytes in this 37 y.o. female admitted on 03/19/2018 with Abdominal Pain   Labs:  Sodium (mmol/L)  Date Value  03/20/2018 133 (L)   Potassium (mmol/L)  Date Value  03/20/2018 3.9   Calcium (mg/dL)  Date Value  54/09/811903/29/2019 8.3 (L)   Albumin (g/dL)  Date Value  14/78/295603/28/2019 5.7 (H)    Plan: No replacement necessary at this time.  Will check BMP, Mg and Ph with AM labs.   Pharmacy to continue to monitor and replace electrolytes per protocol.   Cleopatra CedarStephanie Hezekiah Veltre, PharmD Pharmacy Resident  03/20/2018 4:16 PM

## 2018-03-20 NOTE — Progress Notes (Signed)
   Name: Lindsey Gardner MRN: [id number] DOB: 09-Jun-1981     CONSULTATION DATE: 03/19/2018  Subjective: Lethargic  Objective: Worsening acidosis.  HISTORY OF PRESENT ILLNESS:    PAST MEDICAL HISTORY :   has a past medical history of DM (diabetes mellitus) (HCC).  has no past surgical history on file. Prior to Admission medications   Medication Sig Start Date End Date Taking? Authorizing Provider  insulin lispro (HUMALOG) 100 UNIT/ML injection Inject into the skin continuous. Via insulin pump   Yes [provider]  ondansetron (ZOFRAN-ODT) 4 MG disintegrating tablet Take 4 mg by mouth every 8 (eight) hours as needed for nausea.   Yes [provider]   Allergies  Allergen Reactions  . Sulfa Antibiotics Swelling    FAMILY HISTORY:  family history includes Diabetes in her mother; Hypertension in her mother. SOCIAL HISTORY:  reports that she has never smoked. She does not have any smokeless tobacco history on file. She reports that she does not use drugs.  REVIEW OF SYSTEMS:   Unable to obtain due to critical illness   VITAL SIGNS: Temp:  [98.2 F (36.8 C)-99.6 F (37.6 C)] 98.5 F (36.9 C) (03/29 1200) Pulse Rate:  [79-131] 80 (03/29 1500) Resp:  [12-26] 17 (03/29 1500) BP: (108-159)/(54-91) 109/68 (03/29 1500) SpO2:  [97 %-100 %] 100 % (03/29 1500)  Physical Examination:  Lethargic, oriented and no focal neurological deficits On room air, no distress, able to look in full sentences, equal air entry and no adventitious sounds S1 and S2 are audible with no murmur Benign abdominal exam with fever presents No leg edema  ASSESSMENT / PLAN:  DKA with worsening mixed metabolic acidosis with non-anion gap component with hyper chloremia. -Optimize hydration with half-normal saline, monitor anion gap and venous pH -Glycemic control with insulin drip and consider resuming insulin pump after correction of metabolic derangement.  Reactive leukocytosis,  infective etiology is unlikely was clear chest x-ray and clean urine specimen. -DC Rocephin if continues not to have SIRS.  Pseudohyponatremia with hyperglycemia -Monitor renal panel  Diabetic gastroparesis -Phenergan as needed  Full code  Supportive care  Critical care time 35 minutes

## 2018-03-20 NOTE — Care Management (Signed)
Message left for patient to establish with PCP as one is not listed and outside resource given to [facility] if she chooses.   RNCM will follow.  Patient has health insurance just no PCP listed.

## 2018-03-21 LAB — GLUCOSE, CAPILLARY
GLUCOSE-CAPILLARY: 144 mg/dL — AB (ref 65–99)
GLUCOSE-CAPILLARY: 115 mg/dL — AB (ref 65–99)
GLUCOSE-CAPILLARY: 143 mg/dL — AB (ref 65–99)
GLUCOSE-CAPILLARY: 139 mg/dL — AB (ref 65–99)
GLUCOSE-CAPILLARY: 158 mg/dL — AB (ref 65–99)
GLUCOSE-CAPILLARY: 204 mg/dL — AB (ref 65–99)
GLUCOSE-CAPILLARY: 210 mg/dL — AB (ref 65–99)
GLUCOSE-CAPILLARY: 163 mg/dL — AB (ref 65–99)
GLUCOSE-CAPILLARY: 95 mg/dL (ref 65–99)
GLUCOSE-CAPILLARY: 123 mg/dL — AB (ref 65–99)
GLUCOSE-CAPILLARY: 132 mg/dL — AB (ref 65–99)
Glucose-Capillary: 137 mg/dL — ABNORMAL HIGH (ref 65–99)
Glucose-Capillary: 148 mg/dL — ABNORMAL HIGH (ref 65–99)
Glucose-Capillary: 166 mg/dL — ABNORMAL HIGH (ref 65–99)
Glucose-Capillary: 175 mg/dL — ABNORMAL HIGH (ref 65–99)
Glucose-Capillary: 182 mg/dL — ABNORMAL HIGH (ref 65–99)
Glucose-Capillary: 190 mg/dL — ABNORMAL HIGH (ref 65–99)
Glucose-Capillary: 197 mg/dL — ABNORMAL HIGH (ref 65–99)
Glucose-Capillary: 45 mg/dL — ABNORMAL LOW (ref 65–99)
Glucose-Capillary: 57 mg/dL — ABNORMAL LOW (ref 65–99)
Glucose-Capillary: 90 mg/dL (ref 65–99)

## 2018-03-21 LAB — BASIC METABOLIC PANEL
ANION GAP: 11 (ref 5–15)
ANION GAP: 9 (ref 5–15)
Anion gap: 8 (ref 5–15)
Anion gap: 10 (ref 5–15)
Anion gap: 10 (ref 5–15)
BUN: <5 mg/dL — ABNORMAL LOW (ref 6–20)
BUN: <5 mg/dL — ABNORMAL LOW (ref 6–20)
BUN: 7 mg/dL (ref 6–20)
BUN: <5 mg/dL — ABNORMAL LOW (ref 6–20)
CALCIUM: 7.9 mg/dL — AB (ref 8.9–10.3)
CALCIUM: 8.2 mg/dL — AB (ref 8.9–10.3)
CALCIUM: 8.4 mg/dL — AB (ref 8.9–10.3)
CHLORIDE: 109 mmol/L (ref 101–111)
CHLORIDE: 110 mmol/L (ref 101–111)
CHLORIDE: 110 mmol/L (ref 101–111)
CHLORIDE: 112 mmol/L — AB (ref 101–111)
CHLORIDE: 112 mmol/L — AB (ref 101–111)
CO2: 17 mmol/L — ABNORMAL LOW (ref 22–32)
CO2: 18 mmol/L — AB (ref 22–32)
CO2: 18 mmol/L — AB (ref 22–32)
CO2: 20 mmol/L — AB (ref 22–32)
CO2: 21 mmol/L — AB (ref 22–32)
CREATININE: 0.51 mg/dL (ref 0.44–1.00)
CREATININE: 0.51 mg/dL (ref 0.44–1.00)
CREATININE: 0.46 mg/dL (ref 0.44–1.00)
CREATININE: 0.4 mg/dL — AB (ref 0.44–1.00)
Calcium: 8.3 mg/dL — ABNORMAL LOW (ref 8.9–10.3)
Calcium: 8.8 mg/dL — ABNORMAL LOW (ref 8.9–10.3)
Creatinine, Ser: 0.52 mg/dL (ref 0.44–1.00)
GFR calc Af Amer: >60 mL/min (ref >60)
GFR calc non Af Amer: >60 mL/min (ref >60)
GFR calc non Af Amer: >60 mL/min (ref >60)
GFR calc non Af Amer: >60 mL/min (ref >60)
GFR calc non Af Amer: >60 mL/min (ref >60)
GFR calc non Af Amer: >60 mL/min (ref >60)
GLUCOSE: 210 mg/dL — AB (ref 65–99)
GLUCOSE: 210 mg/dL — AB (ref 65–99)
GLUCOSE: 58 mg/dL — AB (ref 65–99)
Glucose, Bld: 175 mg/dL — ABNORMAL HIGH (ref 65–99)
Glucose, Bld: 126 mg/dL — ABNORMAL HIGH (ref 65–99)
POTASSIUM: 2.6 mmol/L — AB (ref 3.5–5.1)
Potassium: 3.1 mmol/L — ABNORMAL LOW (ref 3.5–5.1)
Potassium: 3.4 mmol/L — ABNORMAL LOW (ref 3.5–5.1)
Potassium: 3.4 mmol/L — ABNORMAL LOW (ref 3.5–5.1)
Potassium: 3 mmol/L — ABNORMAL LOW (ref 3.5–5.1)
Sodium: 138 mmol/L (ref 135–145)
Sodium: 138 mmol/L (ref 135–145)
Sodium: 138 mmol/L (ref 135–145)
Sodium: 139 mmol/L (ref 135–145)
Sodium: 142 mmol/L (ref 135–145)

## 2018-03-21 LAB — CBC WITH DIFFERENTIAL/PLATELET
Basophils Absolute: 0.1 10*3/uL (ref 0–0.1)
Basophils Relative: 1 %
EOS ABS: 0.1 10*3/uL (ref 0–0.7)
Eosinophils Relative: 1 %
HEMATOCRIT: 43.3 % (ref 35.0–47.0)
HEMOGLOBIN: 14.5 g/dL (ref 12.0–16.0)
LYMPHS ABS: 4.7 10*3/uL — AB (ref 1.0–3.6)
Lymphocytes Relative: 43 %
MCH: 28 pg (ref 26.0–34.0)
MCHC: 33.5 g/dL (ref 32.0–36.0)
MCV: 83.4 fL (ref 80.0–100.0)
MONO ABS: 0.8 10*3/uL (ref 0.2–0.9)
MONOS PCT: 8 %
NEUTROS PCT: 49 %
Neutro Abs: 5.3 10*3/uL (ref 1.4–6.5)
Platelets: 300 10*3/uL (ref 150–440)
RBC: 5.19 MIL/uL (ref 3.80–5.20)
RDW: 13.7 % (ref 11.5–14.5)
WBC: 11 10*3/uL (ref 3.6–11.0)

## 2018-03-21 LAB — PHOSPHORUS
Phosphorus: <1 mg/dL — CL (ref 2.5–4.6)
Phosphorus: 3.6 mg/dL (ref 2.5–4.6)
Phosphorus: <1 mg/dL — CL (ref 2.5–4.6)

## 2018-03-21 LAB — URINE CULTURE: Culture: 20000 — AB

## 2018-03-21 LAB — HEMOGLOBIN A1C
HEMOGLOBIN A1C: 9.5 % — AB (ref 4.8–5.6)
MEAN PLASMA GLUCOSE: 225.95 mg/dL

## 2018-03-21 LAB — MAGNESIUM
MAGNESIUM: 1.8 mg/dL (ref 1.7–2.4)
Magnesium: 1.7 mg/dL (ref 1.7–2.4)

## 2018-03-21 MED ORDER — POTASSIUM PHOSPHATES 15 MMOLE/5ML IV SOLN
60.0000 mmol | Freq: Once | INTRAVENOUS | Status: AC
  Administered 2018-03-21: 60 mmol via INTRAVENOUS
  Filled 2018-03-21: qty 20

## 2018-03-21 MED ORDER — INSULIN PUMP
Freq: Three times a day (TID) | SUBCUTANEOUS | Status: DC
  Administered 2018-03-21: 3 via SUBCUTANEOUS
  Administered 2018-03-21 – 2018-03-22 (×2): via SUBCUTANEOUS
  Filled 2018-03-21: qty 1

## 2018-03-21 MED ORDER — POTASSIUM CHLORIDE 20 MEQ PO PACK
40.0000 meq | PACK | ORAL | Status: DC
Start: 2018-03-21 — End: 2018-03-22
  Filled 2018-03-21: qty 2

## 2018-03-21 MED ORDER — POTASSIUM CHLORIDE 10 MEQ/100ML IV SOLN
10.0000 meq | INTRAVENOUS | Status: AC
  Administered 2018-03-21 (×5): 10 meq via INTRAVENOUS
  Filled 2018-03-21 (×4): qty 100

## 2018-03-21 MED ORDER — POTASSIUM PHOSPHATES 15 MMOLE/5ML IV SOLN
45.0000 mmol | Freq: Once | INTRAVENOUS | Status: AC
  Administered 2018-03-21: 45 mmol via INTRAVENOUS
  Filled 2018-03-21: qty 15

## 2018-03-21 MED ORDER — POTASSIUM CHLORIDE CRYS ER 20 MEQ PO TBCR
40.0000 meq | EXTENDED_RELEASE_TABLET | ORAL | Status: DC
  Administered 2018-03-21: 40 meq via ORAL
  Filled 2018-03-21 (×2): qty 2

## 2018-03-21 MED ORDER — POTASSIUM CHLORIDE CRYS ER 20 MEQ PO TBCR: 80.0000 meq | EXTENDED_RELEASE_TABLET | Freq: Once | ORAL | Status: DC

## 2018-03-21 NOTE — Progress Notes (Signed)
eLink Physician-Brief Progress Note Patient Name: Lindsey Gardner DOB: 09/18/1981 MRN: [id number]   Date of Service  03/21/2018  HPI/Events of Note  hypokalemia  eICU Interventions  replace     Intervention Category Minor Interventions: Electrolytes abnormality - evaluation and management  Lindsey Gardner 03/21/2018, 6:01 AM

## 2018-03-21 NOTE — Progress Notes (Signed)
Patient seen and examined. Off insulin infusion and now on her home insulin pump. Eating well and tolerating fluids. Still gets tachycardic with exertion but reports a baseline HR of 90-110. Blood pressure is stable and BGTs<200. Last potassium was 3.4 and she received oral supplementation with 40mEQ.   On exam, patient is pleasant and in no distress; lungs with normal breath sounds, HR mildly tachycardic with  Normal S1/S2, normal bowel sounds and no edema.   Blood pressure 113/83, pulse (!) 108, temperature 98.1 F (36.7 C), temperature source Oral, resp. rate 19, height 5\' 2"  (1.575 m), weight 160 lb (72.6 kg), SpO2 97 %. Patient is stable for transfers to med-surg with telemetry    [facility] ANP-BC Pulmonary and Critical Care Medicine [facility] Pager [phone] or [phone]  NB: This document was prepared using Dragon voice recognition software and may include unintentional dictation errors.

## 2018-03-21 NOTE — Progress Notes (Signed)
   Name: Lindsey Gardner MRN: [id number] DOB: [birth year]     CONSULTATION DATE: 03/19/2018  Subjective: Feels better and tolerating oral intake better than yesterday  Objective: Remains on insulin drip, anion gap closed and improved oral intake  HISTORY OF PRESENT ILLNESS:    PAST MEDICAL HISTORY :   has a past medical history of DM (diabetes mellitus) (HCC).  has no past surgical history on file. Prior to Admission medications   Medication Sig Start Date End Date Taking? Authorizing Provider  insulin lispro (HUMALOG) 100 UNIT/ML injection Inject into the skin continuous. Via insulin pump   Yes [provider]  ondansetron (ZOFRAN-ODT) 4 MG disintegrating tablet Take 4 mg by mouth every 8 (eight) hours as needed for nausea.   Yes [provider]   Allergies  Allergen Reactions  . Sulfa Antibiotics Swelling    FAMILY HISTORY:  family history includes Diabetes in her mother; Hypertension in her mother. SOCIAL HISTORY:  reports that she has never smoked. She does not have any smokeless tobacco history on file. She reports that she does not use drugs.  REVIEW OF SYSTEMS:   Unable to obtain due to critical illness   VITAL SIGNS: Temp:  [98.2 F (36.8 C)-98.6 F (37 C)] 98.5 F (36.9 C) (03/30 1200) Pulse Rate:  [78-109] 99 (03/30 1409) Resp:  [14-28] 15 (03/30 1500) BP: (97-137)/(60-116) 137/116 (03/30 1500) SpO2:  [95 %-100 %] 96 % (03/30 1409)  Physical Examination:  Awake, oriented and no focal neurological deficits On room air, no distress, able to look in full sentences, equal air entry and no adventitious sounds S1 and S2 are audible with no murmur Benign abdominal exam with fever presents No leg edema    ASSESSMENT / PLAN:  DKA.  Improved -Optimize hydration with half-normal saline, monitor anion gap and venous pH -Glycemic control, insulin pump, taper off insulin drip continue to monitor blood sugar.  Reactive leukocytosis, infective  etiology is unlikely was clear chest x-ray and clean urine specimen. -DC Rocephin if continues not to have SIRS.  Pseudohyponatremia (resolved ) with hyperglycemia -Monitor renal panel  Diabetic gastroparesis -Phenergan as needed  Full code  Supportive care  Critical care time 35 minutes

## 2018-03-21 NOTE — Progress Notes (Signed)
Patient ID: Lindsey Gardner, female   DOB: May 11, 1981, 37 y.o.   MRN: [id number]  [facility] PROGRESS NOTE  Lindsey Gardner WUJ:[id number] DOB: 01-May-1981 DOA: 03/19/2018 PCP: System, Pcp Not In  HPI/Subjective: Patient having some nausea.  Family member with gastroenteritis.  Patient had nausea and vomiting for 2 days prior to coming in.  Was admitted with DKA.  Objective: Vitals:   03/21/18 1300 03/21/18 1400  BP:    Pulse:    Resp: (!) 21 14  Temp:    SpO2:      Filed Weights   03/19/18 1430  Weight: 72.6 kg (160 lb)    ROS: Review of Systems  Constitutional: Negative for chills and fever.  Eyes: Negative for blurred vision.  Respiratory: Negative for cough and shortness of breath.   Cardiovascular: Negative for chest pain.  Gastrointestinal: Positive for nausea. Negative for abdominal pain, constipation, diarrhea and vomiting.  Genitourinary: Negative for dysuria.  Musculoskeletal: Negative for joint pain.  Neurological: Negative for dizziness and headaches.   Exam: Physical Exam  Constitutional: She is oriented to person, place, and time.  HENT:  Nose: No mucosal edema.  Mouth/Throat: No oropharyngeal exudate or posterior oropharyngeal edema.  Eyes: Pupils are equal, round, and reactive to light. Conjunctivae, EOM and lids are normal.  Neck: No JVD present. Carotid bruit is not present. No edema present. No thyroid mass and no thyromegaly present.  Cardiovascular: S1 normal and S2 normal. Exam reveals no gallop.  No murmur heard. Pulses:      Dorsalis pedis pulses are 2+ on the right side, and 2+ on the left side.  Respiratory: No respiratory distress. She has no wheezes. She has no rhonchi. She has no rales.  GI: Soft. Bowel sounds are normal. There is no tenderness.  Musculoskeletal:       Left ankle: She exhibits no swelling.  Lymphadenopathy:    She has no cervical adenopathy.  Neurological: She is alert and oriented to person, place, and time. No  cranial nerve deficit.  Skin: Skin is warm. No rash noted. Nails show no clubbing.  Psychiatric: She has a normal mood and affect.      Data Reviewed: Basic Metabolic Panel: Recent Labs  Lab 03/20/18 1250 03/20/18 1843 03/20/18 2342 03/21/18 0720 03/21/18 1145  NA 133* 139 138 138 138  K 3.9 3.8 2.6* 3.1* 3.4*  CL 113* 116* 112* 112* 110  CO2 9* 13* 17* 18* 18*  GLUCOSE 220* 112* 210* 175* 210*  BUN 9 7 7  <5* <5*  CREATININE 0.60 0.42* 0.52 0.51 0.51  CALCIUM 8.3* 8.4* 8.3* 7.9* 8.2*  MG  --   --  1.8  --   --   PHOS  --   --  <1.0* 3.6  --    Liver Function Tests: Recent Labs  Lab 03/19/18 1451  AST 109*  ALT 68*  ALKPHOS 138*  BILITOT 2.0*  PROT 10.5*  ALBUMIN 5.7*   CBC: Recent Labs  Lab 03/19/18 1451  WBC 24.7*  NEUTROABS 22.7*  HGB 18.0*  HCT 55.6*  MCV 89.0  PLT 546*    CBG: Recent Labs  Lab 03/21/18 1025 03/21/18 1127 03/21/18 1232 03/21/18 1335 03/21/18 1440  GLUCAP 204* 210* 163* 95 123*    Recent Results (from the past 240 hour(s))  Urine Culture     Status: Abnormal   Collection Time: 03/19/18  2:51 PM  Result Value Ref Range Status   Specimen Description   Final    URINE,  RANDOM Performed at [facility], [address], [place], [place] [address]    Special Requests   Final    NONE Performed at [facility], [address] [place], [place] [address]    Culture (A)  Final    20,000 COLONIES/mL GROUP B STREP(S.AGALACTIAE)ISOLATED TESTING AGAINST S. AGALACTIAE NOT ROUTINELY PERFORMED DUE TO PREDICTABILITY OF AMP/PEN/VAN SUSCEPTIBILITY. Performed at [facility], [address] [place], [place] [address]    Report Status 03/21/2018 FINAL  Final  MRSA PCR Screening     Status: None   Collection Time: 03/19/18  7:14 PM  Result Value Ref Range Status   MRSA by PCR NEGATIVE NEGATIVE Final    Comment:        The GeneXpert MRSA Assay (FDA approved for NASAL specimens only), is one component of  a comprehensive MRSA colonization surveillance program. It is not intended to diagnose MRSA infection nor to guide or monitor treatment for MRSA infections. Performed at [facility], [address] [place], [place] [address]      Studies: Dg Chest Portable 1 View  Result Date: 03/19/2018 CLINICAL DATA:  Nausea and vomiting EXAM: PORTABLE CHEST 1 VIEW COMPARISON:  None. FINDINGS: The heart size and mediastinal contours are within normal limits. Both lungs are clear. The visualized skeletal structures are unremarkable. IMPRESSION: No active disease. Electronically Signed   By: Jasmine Pang M.D.   On: 03/19/2018 16:07   US Abdomen Limited Ruq  Result Date: 03/19/2018 CLINICAL DATA:  Right upper quadrant pain EXAM: ULTRASOUND ABDOMEN LIMITED RIGHT UPPER QUADRANT COMPARISON:  None. FINDINGS: Gallbladder: No gallstones or wall thickening visualized. No sonographic Murphy sign noted by sonographer. Common bile duct: Diameter: 3 mm Liver: Increased echogenicity. No focal hepatic abnormality. Portal vein is patent on color Doppler imaging with normal direction of blood flow towards the liver. IMPRESSION: 1. Negative for gallstones or biliary dilatation 2. Increased hepatic echogenicity suggesting fatty infiltration Electronically Signed   By: Jasmine Pang M.D.   On: 03/19/2018 18:23    Scheduled Meds: . enoxaparin (LOVENOX) injection  40 mg Subcutaneous Q24H  . lidocaine (PF)  5 mL Intradermal Once  . potassium chloride  40 mEq Oral Q4H   Continuous Infusions: . sodium chloride 125 mL/hr at 03/20/18 1930  . cefTRIAXone (ROCEPHIN)  IV 1 g (03/20/18 1737)  . dextrose 5 % and 0.45% NaCl 125 mL/hr at 03/21/18 0612  . insulin (NOVOLIN-R) infusion 1 Units/hr (03/21/18 1336)    Assessment/Plan:  1. Diabetic ketoacidosis.  Patient was still on insulin drip when I saw her.  Anion gap closed but the bicarb still little bit low.  Hopefully will be able to get over to her insulin pump  soon. 2. Hypokalemia secondary to insulin drip.  On potassium replacement. 3. Urine culture growing out less than 20,000 group B strep.  Can consider stopping antibiotics. 4. Leukocytosis secondary to DKA 5. Hyponatremia secondary to DKA  Code Status:     Code Status Orders  (From admission, onward)        Start     Ordered   03/19/18 1910  Full code  Continuous     03/19/18 1909    Code Status History    This patient has a current code status but no historical code status.     Family Communication: As per critical care specialist Disposition Plan: Once off insulin drip likely can go to the floor if sugars stable on the insulin pump.  Hopefully  tomorrow can discharge home.  Consultants:  Critical care specialist  Time spent: 28 minutes  Delani Kohli [employer]  [employer]

## 2018-03-21 NOTE — Progress Notes (Signed)
Pharmacy Electrolyte Monitoring Consult:  Pharmacy consulted to assist in monitoring and replacing electrolytes in this 37 y.o. female admitted on 03/19/2018 with Abdominal Pain Pt in DKA- on insulin drip  Labs:  Sodium (mmol/L)  Date Value  03/21/2018 138   Potassium (mmol/L)  Date Value  03/21/2018 3.1 (L)   Magnesium (mg/dL)  Date Value  16/10/960403/29/2019 1.8   Phosphorus (mg/dL)  Date Value  54/09/811903/30/2019 3.6   Calcium (mg/dL)  Date Value  14/78/295603/30/2019 7.9 (L)   Albumin (g/dL)  Date Value  21/30/865703/28/2019 5.7 (H)    Plan: K=3.1 phos=3.6  Elink provider ordered 4 runs of IV KCL 10 MEQ and 80 MEQ KCL po once. I will continue with these orders, but will change po dose to 2 doses of 40 MEQ to avoid upsetting pt GI tract.Follow up BMP q 4 hr  Pharmacy to continue to monitor and replace electrolytes per protocol.   Olene FlossMelissa D Donshay Lupinski, Pharm.D, BCPS Clinical Pharmacist  03/21/2018

## 2018-03-21 NOTE — Progress Notes (Signed)
Patient requested for CBG to be checked and it was 45. Food and drink given and patient's recheck was 90. Patient states she felt much better and can tell when her blood sugar gets "high or low". [facility]. Patient transferred at this time and Dawn and NP notified of CBG readings.

## 2018-03-21 NOTE — Progress Notes (Signed)
Pharmacy Electrolyte Monitoring Consult:  Pharmacy consulted to assist in monitoring and replacing electrolytes in this [age] y.o. female admitted on 03/19/2018 with Abdominal Pain   Labs:  Sodium (mmol/L)  Date Value  03/20/2018 138   Potassium (mmol/L)  Date Value  03/20/2018 2.6 (LL)   Magnesium (mg/dL)  Date Value  40/98/119103/29/2019 1.8   Phosphorus (mg/dL)  Date Value  47/82/956203/29/2019 <1.0 (LL)   Calcium (mg/dL)  Date Value  13/08/657803/29/2019 8.3 (L)   Albumin (g/dL)  Date Value  46/96/295203/28/2019 5.7 (H)    Plan: No replacement necessary at this time.  Will check BMP, Mg and Ph with AM labs.   03/29 @ 2300 K 2.6, Phos < 1.0. Will supplement w/ Kphos 45 mmol IV x 1 over 6 hours and will check electrolytes w/ am labs.  Pharmacy to continue to monitor and replace electrolytes per protocol.   Thomasene Rippleavid Beckham Buxbaum, PharmD, BCPS Clinical Pharmacist 03/21/2018

## 2018-03-22 ENCOUNTER — Inpatient Hospital Stay: Payer: BC Managed Care – PPO

## 2018-03-22 LAB — BASIC METABOLIC PANEL
Anion gap: 11 (ref 5–15)
BUN: 6 mg/dL (ref 6–20)
CHLORIDE: 110 mmol/L (ref 101–111)
CO2: 21 mmol/L — AB (ref 22–32)
CREATININE: 0.38 mg/dL — AB (ref 0.44–1.00)
Calcium: 8.7 mg/dL — ABNORMAL LOW (ref 8.9–10.3)
GFR calc Af Amer: >60 mL/min (ref >60)
GFR calc non Af Amer: >60 mL/min (ref >60)
Glucose, Bld: 155 mg/dL — ABNORMAL HIGH (ref 65–99)
Potassium: 4.3 mmol/L (ref 3.5–5.1)
Sodium: 142 mmol/L (ref 135–145)

## 2018-03-22 LAB — HIV ANTIBODY (ROUTINE TESTING W REFLEX): HIV Screen 4th Generation wRfx: NONREACTIVE

## 2018-03-22 LAB — PHOSPHORUS: Phosphorus: 4.7 mg/dL — ABNORMAL HIGH (ref 2.5–4.6)

## 2018-03-22 LAB — MAGNESIUM: Magnesium: 1.9 mg/dL (ref 1.7–2.4)

## 2018-03-22 LAB — CALCIUM, IONIZED: Calcium, Ionized, Serum: 5.1 mg/dL (ref 4.5–5.6)

## 2018-03-22 LAB — GLUCOSE, CAPILLARY: Glucose-Capillary: 190 mg/dL — ABNORMAL HIGH (ref 65–99)

## 2018-03-22 MED ORDER — MAGNESIUM SULFATE 2 GM/50ML IV SOLN
2.0000 g | Freq: Once | INTRAVENOUS | Status: DC
  Filled 2018-03-22: qty 50

## 2018-03-22 MED ORDER — MAGNESIUM OXIDE 400 (241.3 MG) MG PO TABS
400.0000 mg | ORAL_TABLET | Freq: Once | ORAL | Status: AC
  Administered 2018-03-22: 400 mg via ORAL
  Filled 2018-03-22: qty 1

## 2018-03-22 NOTE — Progress Notes (Signed)
Pharmacy Electrolyte Monitoring Consult:  Pharmacy consulted to assist in monitoring and replacing electrolytes in this 37 y.o. female admitted on 03/19/2018 with Abdominal Pain Pt in DKA- on insulin drip  Labs:  Sodium (mmol/L)  Date Value  03/22/2018 142   Potassium (mmol/L)  Date Value  03/22/2018 4.3   Magnesium (mg/dL)  Date Value  16/10/960403/31/2019 1.9   Phosphorus (mg/dL)  Date Value  54/09/811903/31/2019 4.7 (H)   Calcium (mg/dL)  Date Value  14/78/295603/31/2019 8.7 (L)   Albumin (g/dL)  Date Value  21/30/865703/28/2019 5.7 (H)    Plan: K=4.3 phos=4.7, Mg=1.9 No supplementation needed. Patient is now off insulin drip and on the floor, therefore I will sign off.   Olene FlossMelissa D Tiana Sivertson, Pharm.D, BCPS Clinical Pharmacist  03/22/2018

## 2018-03-22 NOTE — Progress Notes (Signed)
Patient ID: Lindsey Gardner [facility] [place] [facility]        Lindsey Gardner was admitted to the Hospital on 03/19/2018 and Discharged  03/22/2018 and should be excused from work/school   for 7  days starting 03/19/2018 , may return to work/school without any restrictions.  Alford Highlandichard Eustolia Drennen M.D on 03/22/2018,at 8:11 AM  [facility] [place] [facility]    Office  [phone]

## 2018-03-22 NOTE — Discharge Summary (Signed)
[employer] - [place] at [facility]   PATIENT NAME: Lindsey Gardner    MR#:  [id number]  DATE OF BIRTH:  [birth year]  DATE OF ADMISSION:  03/19/2018 ADMITTING PHYSICIAN: Shaune Pollack, MD  DATE OF DISCHARGE: 03/22/2018 12:15 PM  PRIMARY CARE PHYSICIAN: Dr Nance Pear   ADMISSION DIAGNOSIS:  RUQ abdominal pain [R10.11] Diabetic ketoacidosis without coma associated with type 1 diabetes mellitus (HCC) [E10.10]  DISCHARGE DIAGNOSIS:  Active Problems:   DKA (diabetic ketoacidoses) (HCC)   SECONDARY DIAGNOSIS:   Past Medical History:  Diagnosis Date  . DM (diabetes mellitus) (HCC)     HOSPITAL COURSE:   1.  Diabetic ketoacidosis.  the patient was admitted to the ICU for insulin drip.  The patient normally does her insulin pump.  She was vomiting for 2 days prior to her presentation.  Family member with a viral gastroenteritis.  Patient feeling better at this point and tolerating diet.  She was switched off the insulin drip to her insulin pump.  Follow-up with her endocrinologist as outpatient. 2.  Hypokalemia and  hypomagnesemia.  These electrolytes were replaced during the hospital course. 3.  Hyponatremia secondary to diabetic ketoacidosis. 4.  Leukocytosis secondary to DKA 5.  Urine culture growing out less than 20,000 group B streptococcus.  This is not a urinary tract infection.  Antibiotics stopped.  DISCHARGE CONDITIONS:   Satisfactory  CONSULTS OBTAINED:  Seen by critical care specialist while in the ICU  DRUG ALLERGIES:   Allergies  Allergen Reactions  . Sulfa Antibiotics Swelling    DISCHARGE MEDICATIONS:   Allergies as of 03/22/2018      Reactions   Sulfa Antibiotics Swelling      Medication List    TAKE these medications   HUMALOG 100 UNIT/ML injection Generic drug:  insulin lispro Inject into the skin continuous. Via insulin pump   ondansetron 4 MG disintegrating tablet Commonly known as:  ZOFRAN-ODT Take 4 mg by mouth every 8  (eight) hours as needed for nausea.        DISCHARGE INSTRUCTIONS:   Follow-up PMD 6 days Follow-up with your endocrinologist  If you experience worsening of your admission symptoms, develop shortness of breath, life threatening emergency, suicidal or homicidal thoughts you must seek medical attention immediately by calling 911 or calling your MD immediately  if symptoms less severe.  You Must read complete instructions/literature along with all the possible adverse reactions/side effects for all the Medicines you take and that have been prescribed to you. Take any new Medicines after you have completely understood and accept all the possible adverse reactions/side effects.   Please note  You were cared for by a hospitalist during your hospital stay. If you have any questions about your discharge medications or the care you received while you were in the hospital after you are discharged, you can call the unit and asked to speak with the hospitalist on call if the hospitalist that took care of you is not available. Once you are discharged, your primary care physician will handle any further medical issues. Please note that NO REFILLS for any discharge medications will be authorized once you are discharged, as it is imperative that you return to your primary care physician (or establish a relationship with a primary care physician if you do not have one) for your aftercare needs so that they can reassess your need for medications and monitor your lab values.    Today   CHIEF COMPLAINT:   Chief Complaint  Patient  presents with  . Abdominal Pain    HISTORY OF PRESENT ILLNESS:  Lindsey Gardner  is a 37 y.o. female presented with abdominal pain nausea vomiting    VITAL SIGNS:  Blood pressure 126/90, pulse 89, temperature 98 F (36.7 C), temperature source Oral, resp. rate 16, height 5\' 2"  (1.575 m), weight 69.9 kg (154 lb), SpO2 98 %.    PHYSICAL EXAMINATION:  GENERAL:  37  y.o.-year-old patient lying in the bed with no acute distress.  EYES: Pupils equal, round, reactive to light and accommodation. No scleral icterus. Extraocular muscles intact.  HEENT: Head atraumatic, normocephalic. Oropharynx and nasopharynx clear.  NECK:  Supple, no jugular venous distention. No thyroid enlargement, no tenderness.  LUNGS: Normal breath sounds bilaterally, no wheezing, rales,rhonchi or crepitation. No use of accessory muscles of respiration.  CARDIOVASCULAR: S1, S2 normal. No murmurs, rubs, or gallops.  ABDOMEN: Soft, non-tender, non-distended. Bowel sounds present. No organomegaly or mass.  EXTREMITIES: No pedal edema, cyanosis, or clubbing.  NEUROLOGIC: Cranial nerves II through XII are intact. Muscle strength 5/5 in all extremities. Sensation intact. Gait not checked.  PSYCHIATRIC: The patient is alert and oriented x 3.  SKIN: No obvious rash, lesion, or ulcer.   DATA REVIEW:   CBC Recent Labs  Lab 03/21/18 2105  WBC 11.0  HGB 14.5  HCT 43.3  PLT 300    Chemistries  Recent Labs  Lab 03/19/18 1451  03/22/18 0449  NA 135   < > 142  K 5.6*   < > 4.3  CL 104   < > 110  CO2 <7*   < > 21*  GLUCOSE 440*   < > 155*  BUN 17   < > 6  CREATININE 0.91   < > 0.38*  CALCIUM 9.5   < > 8.7*  MG  --    < > 1.9  AST 109*  --   --   ALT 68*  --   --   ALKPHOS 138*  --   --   BILITOT 2.0*  --   --    < > = values in this interval not displayed.    Microbiology Results  Results for orders placed or performed during the hospital encounter of 03/19/18  Urine Culture     Status: Abnormal   Collection Time: 03/19/18  2:51 PM  Result Value Ref Range Status   Specimen Description   Final    URINE, RANDOM Performed at [facility], [address] [place], [place] [address]    Special Requests   Final    NONE Performed at [facility], [address] [place], [place] [address]    Culture (A)  Final    20,000 COLONIES/mL GROUP B  STREP(S.AGALACTIAE)ISOLATED TESTING AGAINST S. AGALACTIAE NOT ROUTINELY PERFORMED DUE TO PREDICTABILITY OF AMP/PEN/VAN SUSCEPTIBILITY. Performed at [facility], [address] [place], [place] [address]    Report Status 03/21/2018 FINAL  Final  MRSA PCR Screening     Status: None   Collection Time: 03/19/18  7:14 PM  Result Value Ref Range Status   MRSA by PCR NEGATIVE NEGATIVE Final    Comment:        The GeneXpert MRSA Assay (FDA approved for NASAL specimens only), is one component of a comprehensive MRSA colonization surveillance program. It is not intended to diagnose MRSA infection nor to guide or monitor treatment for MRSA infections. Performed at [facility], [address] [place], [place] [address]  RADIOLOGY:  Dg Chest Port 1 View  Result Date: 03/22/2018 CLINICAL DATA:  Evaluate pneumonia. EXAM: PORTABLE CHEST 1 VIEW COMPARISON:  03/19/2018 FINDINGS: The heart size and mediastinal contours are within normal limits. Both lungs are clear. The visualized skeletal structures are unremarkable. IMPRESSION: No active disease. Electronically Signed   By: Signa Kell M.D.   On: 03/22/2018 07:42      Management plans discussed with the patient, and she is in agreement.  CODE STATUS:     Code Status Orders  (From admission, onward)        Start     Ordered   03/19/18 1910  Full code  Continuous     03/19/18 1909    Code Status History    This patient has a current code status but no historical code status.      TOTAL TIME TAKING CARE OF THIS PATIENT: 34 minutes.    Alford Highland M.D on 03/22/2018 at 3:06 PM  Between 7am to 6pm - Pager - [phone]  After 6pm go to [email or web address] - password [email or web address] [facility]  [employer] Office  [phone]  CC: Primary care physician; Dr Nance Pear

## 2018-03-22 NOTE — Progress Notes (Signed)
03/22/2018 11:00 AM  Dyke MaesAshley Penza to be D/C'd Home per MD order.  Discussed prescriptions and follow up appointments with the patient. Prescriptions given to patient, medication list explained in detail. Pt verbalized understanding.  Allergies as of 03/22/2018      Reactions   Sulfa Antibiotics Swelling      Medication List    TAKE these medications   HUMALOG 100 UNIT/ML injection Generic drug:  insulin lispro Inject into the skin continuous. Via insulin pump   ondansetron 4 MG disintegrating tablet Commonly known as:  ZOFRAN-ODT Take 4 mg by mouth every 8 (eight) hours as needed for nausea.       Vitals:   03/21/18 2246 03/22/18 0438  BP: (!) 131/91 126/90  Pulse: 98 89  Resp: 20 16  Temp: 98.2 F (36.8 C) 98 F (36.7 C)  SpO2: 99% 98%    Skin clean, dry and intact without evidence of skin break down, no evidence of skin tears noted. IV catheter discontinued intact. Site without signs and symptoms of complications. Dressing and pressure applied. Pt denies pain at this time. No complaints noted.  An After Visit Summary was printed and given to the patient. Patient escorted via WC, and D/C home via private auto.  Bradly Chrisougherty, Zoejane Gaulin E

## 2018-03-23 LAB — GLUCOSE, CAPILLARY: Glucose-Capillary: 72 mg/dL (ref 65–99)

## 2019-05-12 IMAGING — DX DG CHEST 1V PORT
1 series · 1 of 1 positions shown · non-contrast
Comparison: 03/19/2018

CLINICAL DATA: Evaluate pneumonia.

EXAM:
PORTABLE CHEST 1 VIEW

[chest ap]
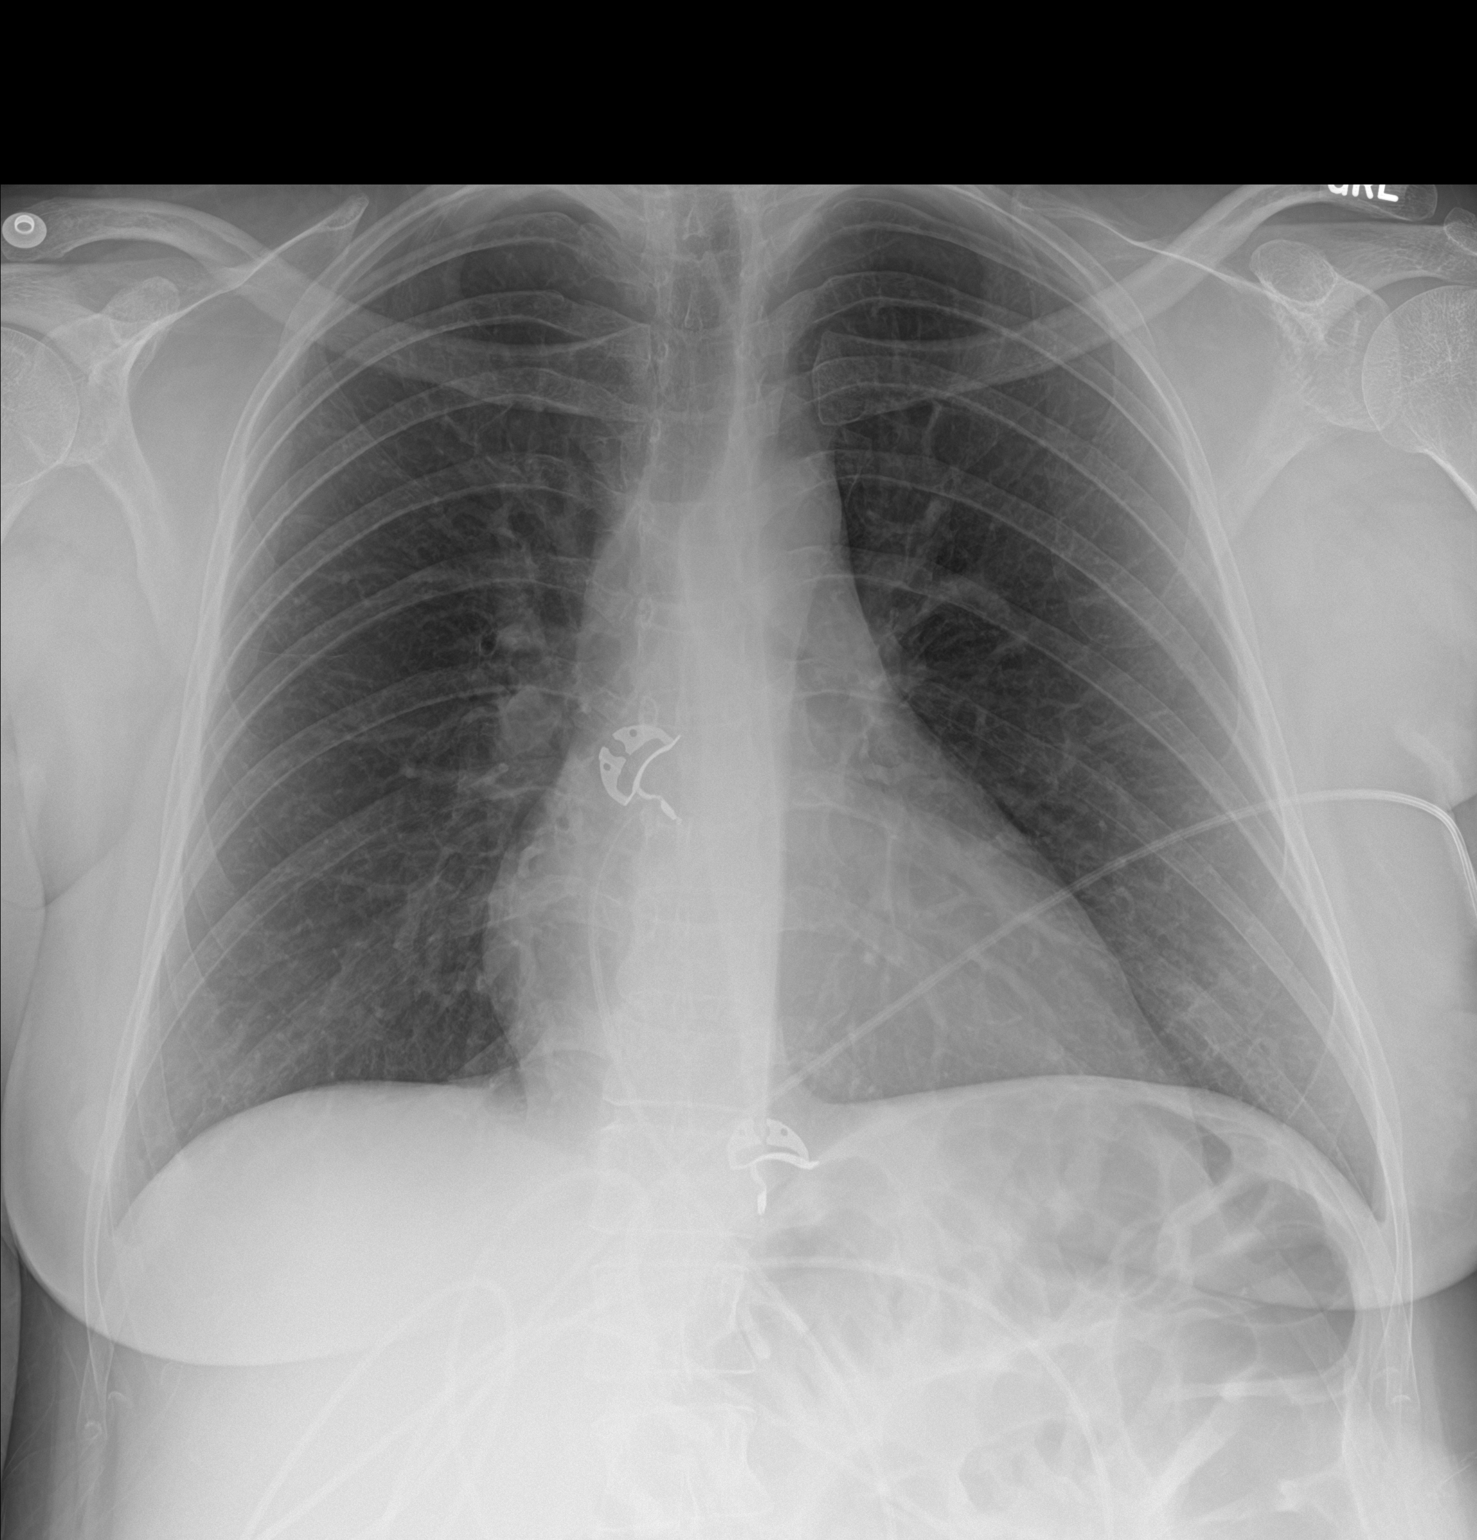

[1 of 1 positions shown; findings below may reference images not displayed]

FINDINGS: The heart size and mediastinal contours are within normal limits.
Both lungs are clear. The visualized skeletal structures are
unremarkable.
IMPRESSION: No active disease.

## 2019-10-11 IMAGING — US US ABDOMEN LIMITED
1 series · 14 of 25 positions shown · non-contrast
Comparison: None.

CLINICAL DATA: Right upper quadrant pain

EXAM:
ULTRASOUND ABDOMEN LIMITED RIGHT UPPER QUADRANT

[Series 1: us abdomen limited · 0.19mm/px · 14 of 26 slices shown]
[im 1/26]
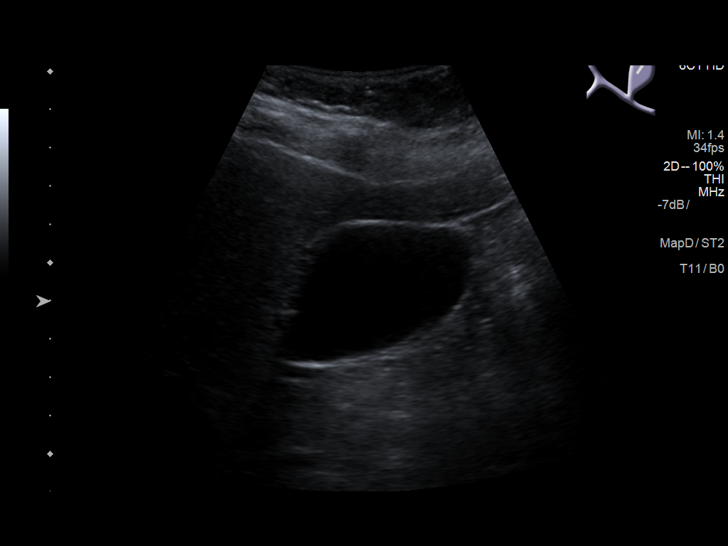
[im 3/26]
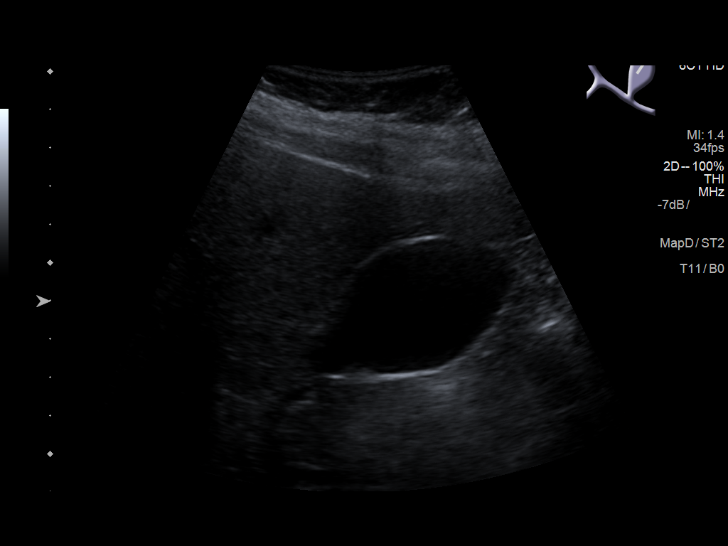
[im 5/26]
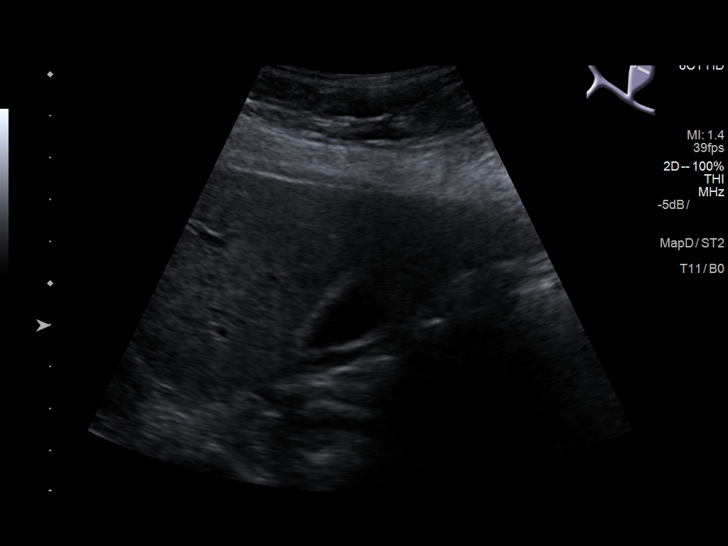
[im 7/26]
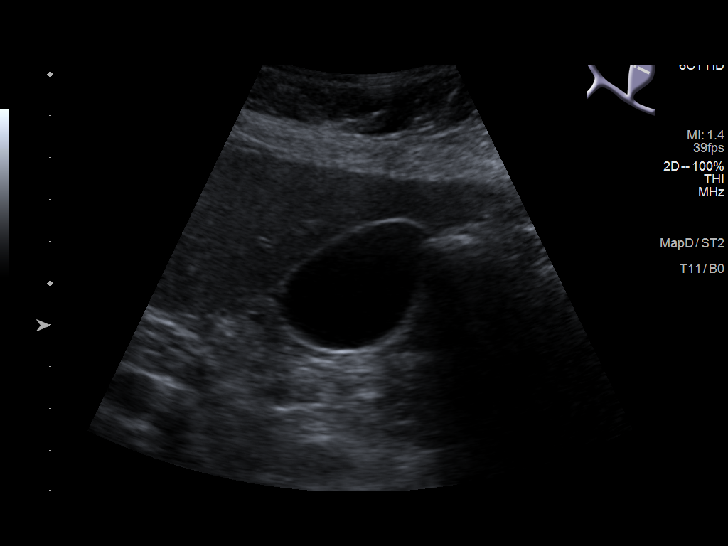
[im 9/26]
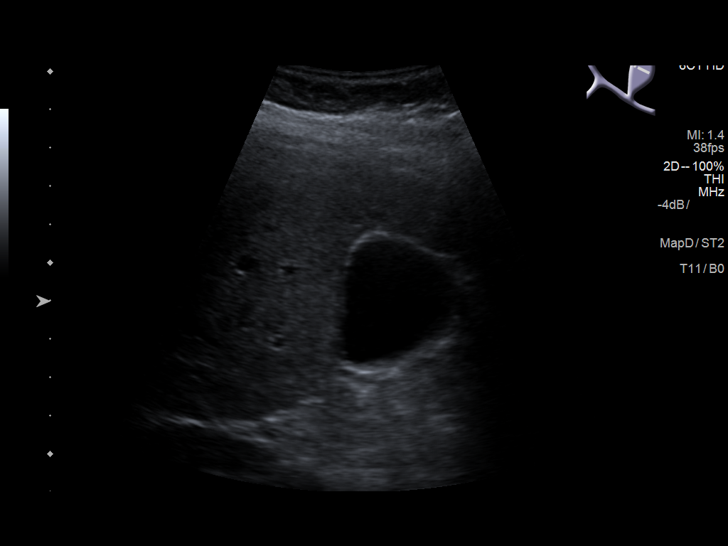
[im 10/26]
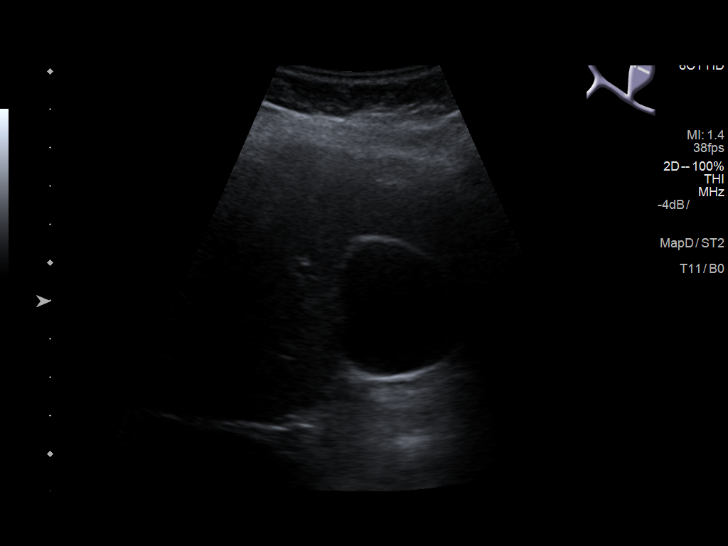
[im 12/26]
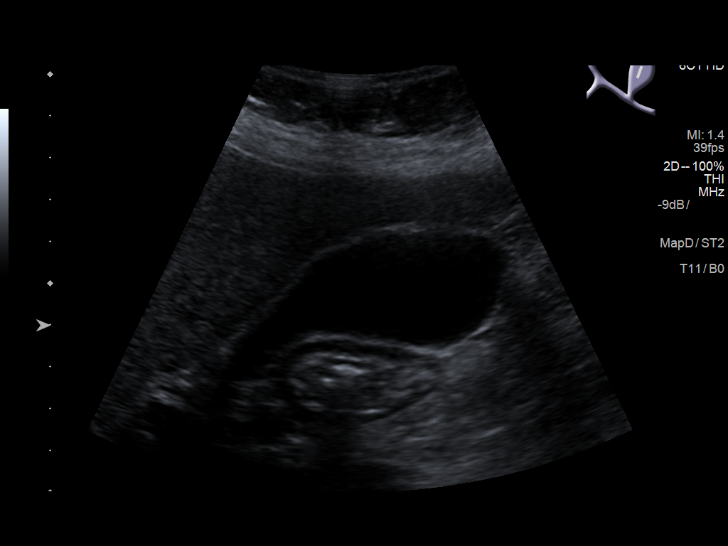
[im 14/26]
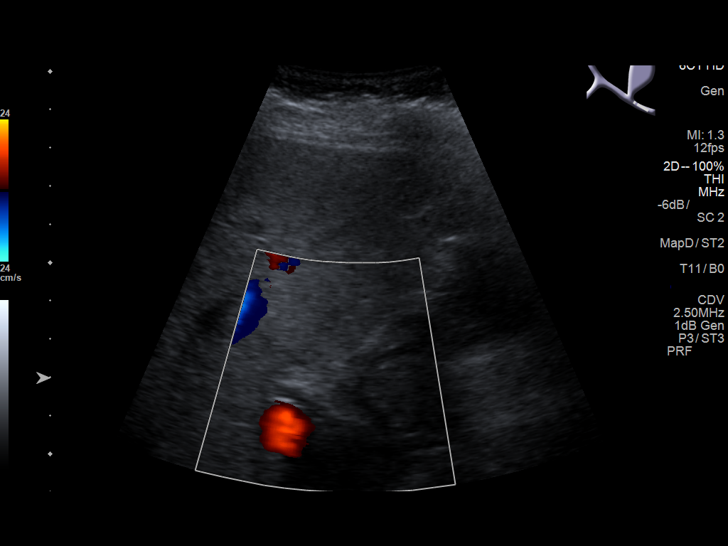
[im 16/26]
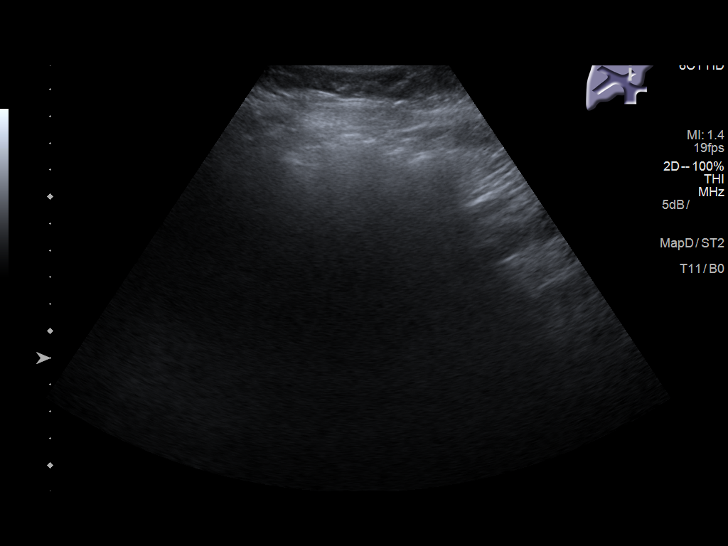
[im 17/26]
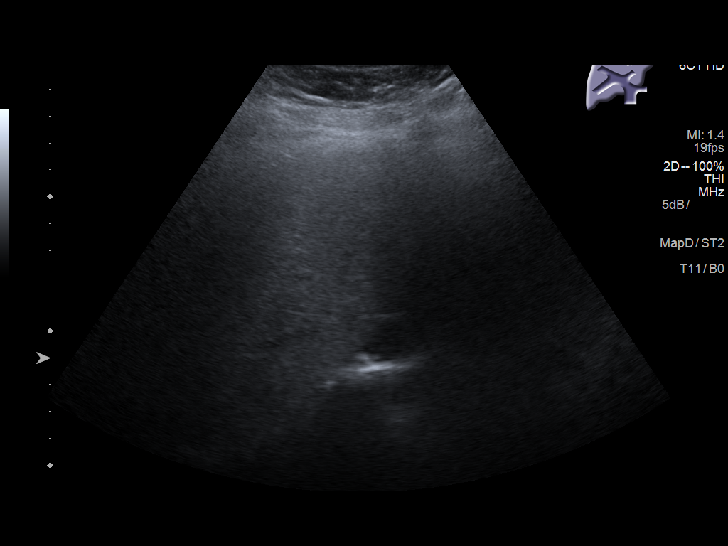
[im 19/26]
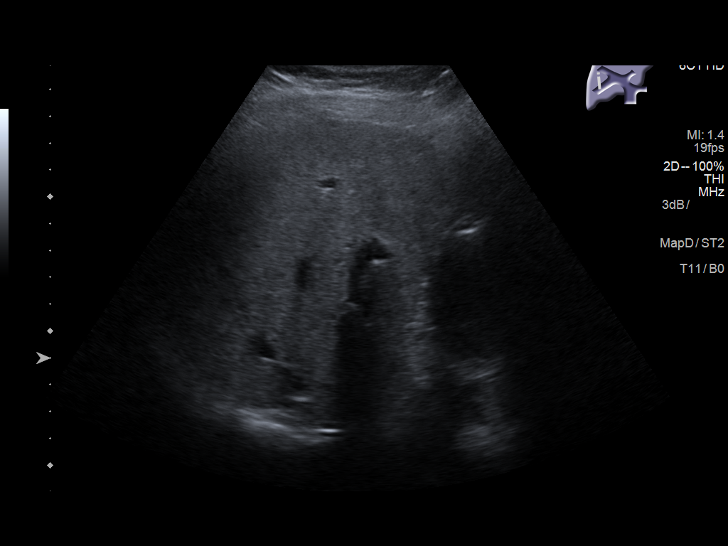
[im 21/26]
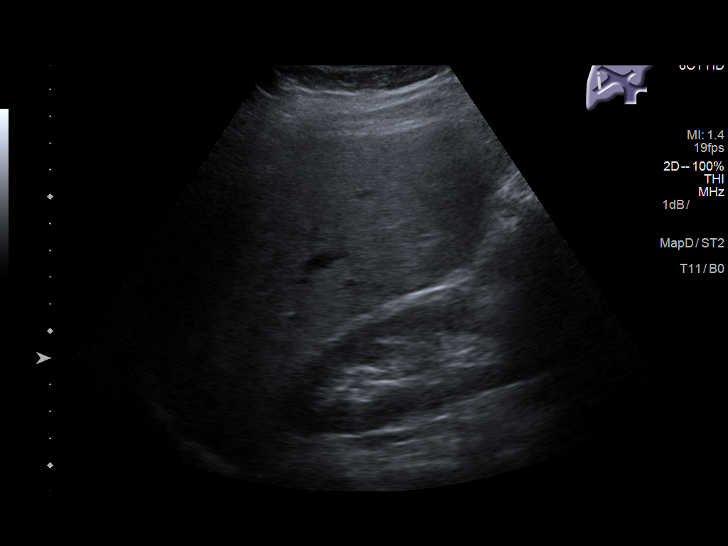
[im 23/26]
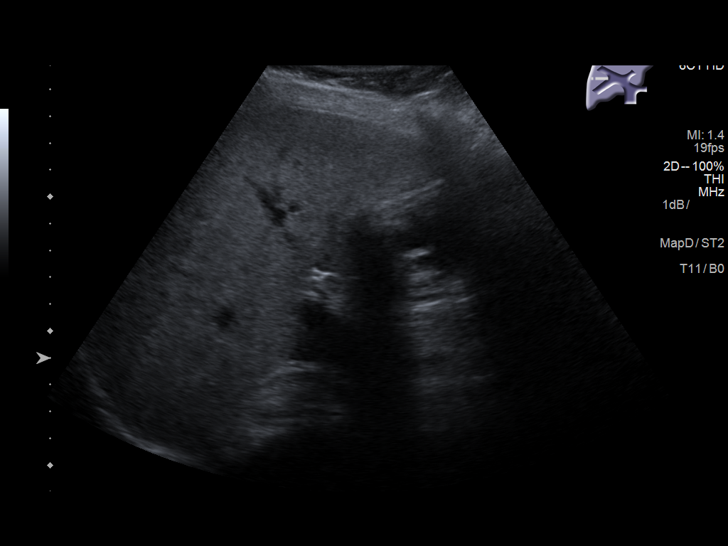
[im 26/26]
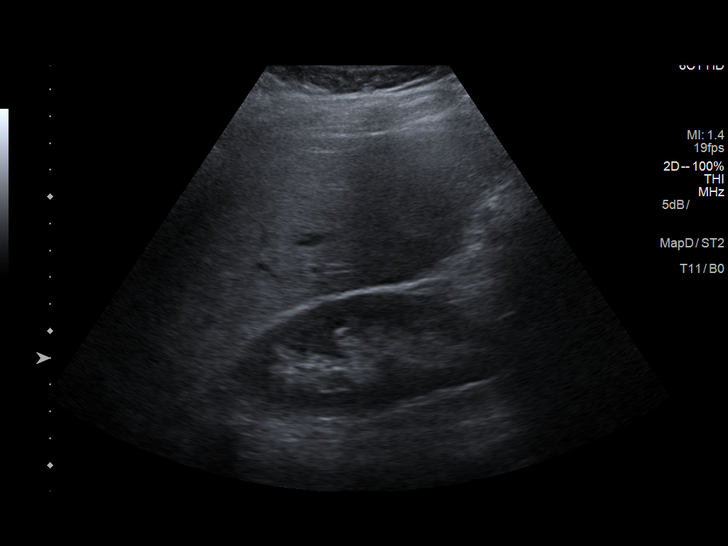

[14 of 25 positions shown; findings below may reference images not displayed]

FINDINGS: Gallbladder:

No gallstones or wall thickening visualized. No sonographic Murphy
sign noted by sonographer.

Common bile duct:

Diameter: 3 mm

Liver:

Increased echogenicity. No focal hepatic abnormality. Portal vein is
patent on color Doppler imaging with normal direction of blood flow
towards the liver.
IMPRESSION: 1. Negative for gallstones or biliary dilatation
2. Increased hepatic echogenicity suggesting fatty infiltration

## 2024-06-24 ENCOUNTER — Emergency Department
Admission: EM | Admit: 2024-06-24 | Discharge: 2024-06-24 | Disposition: A | Discharge status: Home / Self Care | Attending: Emergency Medicine | Admitting: Emergency Medicine

## 2024-06-24 DIAGNOSIS — E162 Hypoglycemia, unspecified: Secondary | ICD-10-CM

## 2024-06-24 DIAGNOSIS — R112 Nausea with vomiting, unspecified: Secondary | ICD-10-CM | POA: Diagnosis not present

## 2024-06-24 DIAGNOSIS — E11649 Type 2 diabetes mellitus with hypoglycemia without coma: Secondary | ICD-10-CM | POA: Insufficient documentation

## 2024-06-24 LAB — CBC WITH DIFFERENTIAL/PLATELET
Abs Immature Granulocytes: 0.04 10*3/uL (ref 0.00–0.07)
Basophils Absolute: 0 10*3/uL (ref 0.0–0.1)
Basophils Relative: 0 %
Eosinophils Absolute: 0 10*3/uL (ref 0.0–0.5)
Eosinophils Relative: 0 %
HCT: 38.3 % (ref 36.0–46.0)
Hemoglobin: 13.2 g/dL (ref 12.0–15.0)
Immature Granulocytes: 1 %
Lymphocytes Relative: 18 %
Lymphs Abs: 1.5 10*3/uL (ref 0.7–4.0)
MCH: 29.1 pg (ref 26.0–34.0)
MCHC: 34.5 g/dL (ref 30.0–36.0)
MCV: 84.4 fL (ref 80.0–100.0)
Monocytes Absolute: 0.5 10*3/uL (ref 0.1–1.0)
Monocytes Relative: 6 %
Neutro Abs: 6.1 10*3/uL (ref 1.7–7.7)
Neutrophils Relative %: 75 %
Platelets: 337 10*3/uL (ref 150–400)
RBC: 4.54 MIL/uL (ref 3.87–5.11)
RDW: 13.3 % (ref 11.5–15.5)
WBC: 8.1 10*3/uL (ref 4.0–10.5)
nRBC: 0 % (ref 0.0–0.2)

## 2024-06-24 LAB — CBG MONITORING, ED
Glucose-Capillary: 167 mg/dL — ABNORMAL HIGH (ref 70–99)
Glucose-Capillary: 196 mg/dL — ABNORMAL HIGH (ref 70–99)
Glucose-Capillary: 211 mg/dL — ABNORMAL HIGH (ref 70–99)

## 2024-06-24 LAB — COMPREHENSIVE METABOLIC PANEL WITH GFR
ALT: 11 U/L (ref 0–44)
AST: 24 U/L (ref 15–41)
Albumin: 4 g/dL (ref 3.5–5.0)
Alkaline Phosphatase: 68 U/L (ref 38–126)
Anion gap: 11 (ref 5–15)
BUN: 10 mg/dL (ref 6–20)
CO2: 20 mmol/L — ABNORMAL LOW (ref 22–32)
Calcium: 8.8 mg/dL — ABNORMAL LOW (ref 8.9–10.3)
Chloride: 105 mmol/L (ref 98–111)
Creatinine, Ser: 0.5 mg/dL (ref 0.44–1.00)
GFR, Estimated: >60 mL/min (ref >60)
Glucose, Bld: 192 mg/dL — ABNORMAL HIGH (ref 70–99)
Potassium: 3.8 mmol/L (ref 3.5–5.1)
Sodium: 136 mmol/L (ref 135–145)
Total Bilirubin: 0.6 mg/dL (ref 0.0–1.2)
Total Protein: 7.6 g/dL (ref 6.5–8.1)

## 2024-06-24 LAB — PREGNANCY, URINE: Preg Test, Ur: NEGATIVE

## 2024-06-24 MED ORDER — METOCLOPRAMIDE HCL 5 MG/ML IJ SOLN
10.0000 mg | Freq: Once | INTRAMUSCULAR | Status: AC
  Administered 2024-06-24: 10 mg via INTRAVENOUS
  Filled 2024-06-24: qty 2

## 2024-06-24 MED ORDER — SODIUM CHLORIDE 0.9 % IV BOLUS
500.0000 mL | Freq: Once | INTRAVENOUS | Status: AC
  Administered 2024-06-24: 500 mL via INTRAVENOUS

## 2024-06-24 NOTE — ED Provider Notes (Signed)
 [facility] Provider Note    Event Date/Time   First MD Initiated Contact with Patient 06/24/24 0207     (approximate)   History   Hypoglycemia   HPI  Lindsey Gardner is a 43 y.o. female with history of diabetes presenting with hypoglycemia.  Patient states that she has an insulin  pump that gives her a basal dose of lispro, will give herself a bolus at mealtimes.  Did give herself a bolus at dinnertime.  Did have an episode of nausea vomiting earlier but she denies any diarrhea, urinary symptoms, cough, chest pain, shortness of breath, fever or other infectious symptoms.  No sick contacts.  Per independent history from EMS, her glucose was around 40 during the night, was fluctuating between 40-80.  Patient states that she does not take any long-acting insulin  or other diabetic medications.  Independent history obtained from EMS as above.  On independent review, she is seen by endocrine at [facility], this was in May 2025, uses an OmniPod, had struggled to stay in range previously, has not been given correctional doses or increase her mealtime boluses resulting low sugars after that.  After that visit, they had discussed the Dexcom at, continued her on Ozempic, also turned off reverse correction.     Physical Exam   Triage Vital Signs: ED Triage Vitals  Encounter Vitals Group     BP      Girls Systolic BP Percentile      Girls Diastolic BP Percentile      Boys Systolic BP Percentile      Boys Diastolic BP Percentile      Pulse      Resp      Temp      Temp src      SpO2      Weight      Height      Head Circumference      Peak Flow      Pain Score      Pain Loc      Pain Education      Exclude from Growth Chart     Most recent vital signs: Vitals:   06/24/24 0330 06/24/24 0400  BP: 117/72 117/71  Pulse: 87 84  Resp: (!) 21 20  Temp:    SpO2: 98% 97%     General: Awake, no distress.  CV:  Good peripheral perfusion.  Resp:  Normal effort.   Clear Abd:  No distention.  Soft nontender Other:  Well-appearing, moving all 4 extremities   ED Results / Procedures / Treatments   Labs (all labs ordered are listed, but only abnormal results are displayed) Labs Reviewed  COMPREHENSIVE METABOLIC PANEL WITH GFR - Abnormal; Notable for the following components:      Result Value   CO2 20 (*)    Glucose, Bld 192 (*)    Calcium 8.8 (*)    All other components within normal limits  CBG MONITORING, ED - Abnormal; Notable for the following components:   Glucose-Capillary 167 (*)    All other components within normal limits  CBG MONITORING, ED - Abnormal; Notable for the following components:   Glucose-Capillary 196 (*)    All other components within normal limits  CBG MONITORING, ED - Abnormal; Notable for the following components:   Glucose-Capillary 211 (*)    All other components within normal limits  CBC WITH DIFFERENTIAL/PLATELET  PREGNANCY, URINE  CBG MONITORING, ED  CBG MONITORING, ED  CBG MONITORING, ED  CBG MONITORING, ED    PROCEDURES:  Critical Care performed: No  Procedures   MEDICATIONS ORDERED IN ED: Medications  metoCLOPramide (REGLAN) injection 10 mg (10 mg Intravenous Given 06/24/24 0303)  sodium chloride  0.9 % bolus 500 mL (500 mLs Intravenous New Bag/Given 06/24/24 0303)     IMPRESSION / MDM / ASSESSMENT AND PLAN / ED COURSE  I reviewed the triage vital signs and the nursing notes.                              Differential diagnosis includes, but is not limited to, overcorrection of insulin , early gastroenteritis, viral illness, medication side effect.  No abdominal pain on exam to warrant imaging at this time, no other infectious symptoms.  Will get labs, will have her monitor her glucose here.  If stable, she can be discharged with outpatient endocrine follow-up.  Shared decision making done with patient and she is agreeable with this plan.  Patient's presentation is most consistent with acute  presentation with potential threat to life or bodily function.  Independent interpretation of labs and imaging below.  Clinical course as below, reassessment patient is tolerating p.o.  Considered but no indication for inpatient admission at this time, she is safe for outpatient management.  Will discharge with strict precautions.  Instructed her to follow-up with endocrinologist in the morning to see if she needs adjustments to her insulin .  Shared decision making done with patient and she is agreeable with this plan.    Clinical Course as of 06/24/24 0438  Thu Jun 24, 2024  0255 Independent review of labs, pregnancy test is negative, no leukocytosis, electrolytes not severely deranged, she is not been hypoglycemic here, LFTs are normal. [TT]  0420 Glucose-Capillary(!): 211 Repeat blood glucose have not revealed any hypoglycemia, on the app, she has had no hypoglycemic episodes since being in the emergency department.  Nausea has improved, will p.o. challenge and plan to discharge.  Patient states that she is followed closely by endocrine, will be able to contact them tomorrow morning to discuss if she needs any adjustments to her insulin . [TT]    Clinical Course User Index [TT] Waymond, Lorelle Cummins, MD     FINAL CLINICAL IMPRESSION(S) / ED DIAGNOSES   Final diagnoses:  Hypoglycemia  Nausea and vomiting, unspecified vomiting type     Rx / DC Orders   ED Discharge Orders     None        Note:  This document was prepared using Dragon voice recognition software and may include unintentional dictation errors.    Waymond Lorelle Cummins, MD 06/24/24 [phone]

## 2024-06-24 NOTE — Discharge Instructions (Signed)
 Please make sure to contact your endocrinologist in the morning to discuss if you need any adjustments to your insulin .  Please take the Phenergan  that you have at home as prescribed for your nausea.

## 2024-06-24 NOTE — ED Triage Notes (Signed)
 BIB EMS for CBG 40 during the night. After that patient states that she tried to eat something when it dropped, but became nauseas after eating and vomited. Patient states after that, Her CBG have been fluctuating between 40-80 since around 0100.
